# Patient Record
Sex: Female | Born: 1965
Health system: Southern US, Community
[De-identification: ages and names within clinical notes are randomized; demographics above are authoritative.]

## PROBLEM LIST (undated history)

## (undated) DIAGNOSIS — Z809 Family history of malignant neoplasm, unspecified: Secondary | ICD-10-CM

## (undated) DIAGNOSIS — Z1379 Encounter for other screening for genetic and chromosomal anomalies: Principal | ICD-10-CM

## (undated) HISTORY — PX: BREAST EXCISIONAL BIOPSY: SUR124

## (undated) HISTORY — PX: WISDOM TOOTH EXTRACTION: SHX21

## (undated) HISTORY — DX: Family history of malignant neoplasm, unspecified: Z80.9

## (undated) HISTORY — DX: Encounter for other screening for genetic and chromosomal anomalies: Z13.79

---

## 2000-01-23 ENCOUNTER — Encounter: Payer: Self-pay | Admitting: Emergency Medicine

## 2000-01-23 ENCOUNTER — Emergency Department (HOSPITAL_COMMUNITY): Admission: EM | Admit: 2000-01-23 | Discharge: 2000-01-23 | Payer: Self-pay | Admitting: Emergency Medicine

## 2016-09-24 DIAGNOSIS — M791 Myalgia: Secondary | ICD-10-CM | POA: Diagnosis not present

## 2016-09-24 DIAGNOSIS — M75100 Unspecified rotator cuff tear or rupture of unspecified shoulder, not specified as traumatic: Secondary | ICD-10-CM | POA: Diagnosis not present

## 2016-10-20 DIAGNOSIS — Z0000 Encounter for general adult medical examination without abnormal findings: Secondary | ICD-10-CM | POA: Diagnosis not present

## 2016-10-20 DIAGNOSIS — Z13220 Encounter for screening for lipoid disorders: Secondary | ICD-10-CM | POA: Diagnosis not present

## 2016-12-05 DIAGNOSIS — Z1211 Encounter for screening for malignant neoplasm of colon: Secondary | ICD-10-CM | POA: Diagnosis not present

## 2016-12-05 DIAGNOSIS — D126 Benign neoplasm of colon, unspecified: Secondary | ICD-10-CM | POA: Diagnosis not present

## 2017-03-06 DIAGNOSIS — M9908 Segmental and somatic dysfunction of rib cage: Secondary | ICD-10-CM | POA: Diagnosis not present

## 2017-03-06 DIAGNOSIS — M791 Myalgia: Secondary | ICD-10-CM | POA: Diagnosis not present

## 2017-03-06 DIAGNOSIS — M9902 Segmental and somatic dysfunction of thoracic region: Secondary | ICD-10-CM | POA: Diagnosis not present

## 2017-05-18 DIAGNOSIS — J069 Acute upper respiratory infection, unspecified: Secondary | ICD-10-CM | POA: Diagnosis not present

## 2017-06-30 DIAGNOSIS — D2371 Other benign neoplasm of skin of right lower limb, including hip: Secondary | ICD-10-CM | POA: Diagnosis not present

## 2017-06-30 DIAGNOSIS — L821 Other seborrheic keratosis: Secondary | ICD-10-CM | POA: Diagnosis not present

## 2017-06-30 DIAGNOSIS — L720 Epidermal cyst: Secondary | ICD-10-CM | POA: Diagnosis not present

## 2017-06-30 DIAGNOSIS — D485 Neoplasm of uncertain behavior of skin: Secondary | ICD-10-CM | POA: Diagnosis not present

## 2017-06-30 DIAGNOSIS — D1801 Hemangioma of skin and subcutaneous tissue: Secondary | ICD-10-CM | POA: Diagnosis not present

## 2017-06-30 DIAGNOSIS — L570 Actinic keratosis: Secondary | ICD-10-CM | POA: Diagnosis not present

## 2017-09-01 HISTORY — PX: BREAST EXCISIONAL BIOPSY: SUR124

## 2017-10-26 ENCOUNTER — Other Ambulatory Visit: Payer: Self-pay | Admitting: Family Medicine

## 2017-10-26 ENCOUNTER — Other Ambulatory Visit (HOSPITAL_COMMUNITY)
Admission: RE | Admit: 2017-10-26 | Discharge: 2017-10-26 | Disposition: A | Discharge status: Home / Self Care | Payer: 59 | Source: Ambulatory Visit | Attending: Family Medicine | Admitting: Family Medicine

## 2017-10-26 DIAGNOSIS — Z0000 Encounter for general adult medical examination without abnormal findings: Secondary | ICD-10-CM | POA: Diagnosis not present

## 2017-10-26 DIAGNOSIS — Z124 Encounter for screening for malignant neoplasm of cervix: Secondary | ICD-10-CM | POA: Insufficient documentation

## 2017-10-26 DIAGNOSIS — Z131 Encounter for screening for diabetes mellitus: Secondary | ICD-10-CM | POA: Diagnosis not present

## 2017-10-26 DIAGNOSIS — Z136 Encounter for screening for cardiovascular disorders: Secondary | ICD-10-CM | POA: Diagnosis not present

## 2017-10-28 LAB — CYTOLOGY - PAP: Diagnosis: NEGATIVE

## 2017-11-03 ENCOUNTER — Other Ambulatory Visit: Payer: Self-pay | Admitting: Family Medicine

## 2017-11-03 ENCOUNTER — Encounter: Payer: Self-pay | Admitting: Genetic Counselor

## 2017-11-03 ENCOUNTER — Telehealth: Payer: Self-pay | Admitting: Genetic Counselor

## 2017-11-03 DIAGNOSIS — Z139 Encounter for screening, unspecified: Secondary | ICD-10-CM

## 2017-11-03 NOTE — Telephone Encounter (Signed)
Genetic counseling appt has been scheduled for the pt to see Roma Kayser on 4/22 at 9am. Pt aware to arrive 30 minutes early. Letter mailed.

## 2017-11-30 ENCOUNTER — Ambulatory Visit: Payer: Self-pay

## 2017-12-17 ENCOUNTER — Ambulatory Visit
Admission: RE | Admit: 2017-12-17 | Discharge: 2017-12-17 | Disposition: A | Discharge status: Home / Self Care | Payer: 59 | Source: Ambulatory Visit | Attending: Family Medicine | Admitting: Family Medicine

## 2017-12-17 DIAGNOSIS — Z1231 Encounter for screening mammogram for malignant neoplasm of breast: Secondary | ICD-10-CM | POA: Diagnosis not present

## 2017-12-17 DIAGNOSIS — Z139 Encounter for screening, unspecified: Secondary | ICD-10-CM

## 2017-12-21 ENCOUNTER — Other Ambulatory Visit: Payer: Self-pay

## 2017-12-21 ENCOUNTER — Encounter: Payer: Self-pay | Admitting: Genetic Counselor

## 2017-12-25 ENCOUNTER — Telehealth: Payer: Self-pay | Admitting: Genetic Counselor

## 2017-12-25 NOTE — Telephone Encounter (Signed)
Pt cld to reschedule genetic counseling appt. Appt has been rescheduled for the pt to see Ofri on 5/10 at

## 2018-01-08 ENCOUNTER — Encounter: Payer: Self-pay | Admitting: Genetic Counselor

## 2018-01-08 ENCOUNTER — Inpatient Hospital Stay: Payer: 59

## 2018-01-08 ENCOUNTER — Inpatient Hospital Stay: Payer: 59 | Attending: Genetic Counselor | Admitting: Genetic Counselor

## 2018-01-08 DIAGNOSIS — Z7183 Encounter for nonprocreative genetic counseling: Secondary | ICD-10-CM | POA: Diagnosis not present

## 2018-01-08 DIAGNOSIS — Z809 Family history of malignant neoplasm, unspecified: Secondary | ICD-10-CM

## 2018-01-08 NOTE — Progress Notes (Signed)
[facility]      Initial Visit   Patient Name: Alisha [name] Patient DOB: 06-13-1966 Patient Age: 52 y.o. Encounter Date: 01/08/2018  Referring Provider: Kelton Pillar, MD  Primary Care Provider: Kelton Pillar, MD  Reason for Visit: Evaluate for hereditary susceptibility to cancer    Assessment and Plan:  . Ms. Alisha [name]'s family history is not suggestive of a hereditary predisposition to cancer given the ages at diagnosis of breast and colon cancer in her mother.   . Testing is available to determine whether she has a pathogenic mutation that will impact her screening and risk-reduction for cancer. A negative result will be reassuring.  . Ms. Alisha [name] wished to pursue genetic testing and self-pay ($250) and a blood sample will be sent for analysis of the 83 genes on [facility]'s Multi-Cancer panel (ALK, APC, ATM, AXIN2, BAP1, BARD1, BLM, BMPR1A, BRCA1, BRCA2, BRIP1, CASR, CDC73, CDH1, CDK4, CDKN1B, CDKN1C, CDKN2A, CEBPA, CHEK2, CTNNA1, DICER1, DIS3L2, EGFR, EPCAM, FH, FLCN, GATA2, GPC3, GREM1, HOXB13, HRAS, KIT, MAX, MEN1, MET, MITF, MLH1, MSH2, MSH3, MSH6, MUTYH, NBN, NF1, NF2, NTHL1, PALB2, PDGFRA, PHOX2B, PMS2, POLD1, POLE, POT1, PRKAR1A, PTCH1, PTEN, RAD50, RAD51C, RAD51D, RB1, RECQL4, RET, RUNX1, SDHA, SDHAF2, SDHB, SDHC, SDHD, SMAD4, SMARCA4, SMARCB1, SMARCE1, STK11, SUFU, TERC, TERT, TMEM127, TP53, TSC1, TSC2, VHL, WRN, WT1).   . Results should be available in approximately 2-3 weeks, at which point we will contact her and address implications for her as well as address genetic testing for at-risk family members, if needed.     Dr. Jana Hakim was available for questions concerning this case. Total time spent by me in face-to-face counseling was approximately 30 minutes.   _____________________________________________________________________   History of Present Illness: Ms. Alisha [name], a 52 y.o. female, is being seen at the [facility] due to a family history of cancer. She presents to clinic today to discuss the possibility of a hereditary predisposition to cancer and discuss whether genetic testing is warranted.  Ms. Alisha [name] has no personal history of cancer. She stated that she undergoes a yearly mammogram, clinical breast exam and gynecologic exam. She reported having a colonoscopy around age [age] and had one polyps removed (type not specified). She was asked to return in 5 years.  Past Medical History:  Diagnosis Date  . Family history of cancer     Social History   Socioeconomic History  . Marital status: Married    Spouse name: Not on file  . Number of children: Not on file  . Years of education: Not on file  . Highest education level: Not on file  Occupational History  . Not on file  Social Needs  . Financial resource strain: Not on file  . Food insecurity:    Worry: Not on file    Inability: Not on file  . Transportation needs:    Medical: Not on file    Non-medical: Not on file  Tobacco Use  . Smoking status: Not on file  Substance and Sexual Activity  . Alcohol use: Not on file  . Drug use: Not on file  . Sexual activity: Not on file  Lifestyle  . Physical activity:    Days per week: Not on file    Minutes per session: Not on file  . Stress: Not on file  Relationships  . Social connections:    Talks on phone: Not on file    Gets together: Not on file  Attends religious service: Not on file    Active member of club or organization: Not on file    Attends meetings of clubs or organizations: Not on file    Relationship status: Not on file  Other Topics Concern  . Not on file  Social History Narrative  . Not on file     Family History:  During the visit, a 4-generation pedigree was obtained. Family tree will be scanned in the Media tab in Epic  Significant diagnoses include the following:  Family History  Problem Relation Age of Onset  . Breast cancer Mother 9  . Colon  cancer Mother 67       metastatic BC recently; 8 yo currently  . Melanoma Father        dx 6s; currently 41  . Cancer Paternal Aunt        unk. primary; deceased 78s    Additionally, Ms. Alisha [name] has no children. She has one brother (age 18). Her mother (noted above) was an only child. Her father (noted above) had 5 sisters.  Ms. Alisha [name] ancestry is Caucasian - NOS. There is no known Jewish ancestry and no consanguinity.  Discussion: We reviewed the characteristics, features and inheritance patterns of hereditary cancer syndromes. We discussed her risk of harboring a mutation in the context of her personal and family history. We discussed the process of genetic testing, insurance coverage and implications of results: positive, negative and variant of unknown significance (VUS).    Ms. Alisha [name] questions were answered to her satisfaction today and she is welcome to call with any additional questions or concerns. Thank you for the referral and allowing [place] to share in the care of your patient.    Steele Berg, MS, [place] Certified Genetic Counselor phone: [phone] [email or web address]

## 2018-01-13 ENCOUNTER — Other Ambulatory Visit: Payer: Self-pay

## 2018-01-13 ENCOUNTER — Encounter: Payer: Self-pay | Admitting: Genetic Counselor

## 2018-01-14 ENCOUNTER — Encounter: Payer: Self-pay | Admitting: Genetic Counselor

## 2018-01-14 DIAGNOSIS — Z1379 Encounter for other screening for genetic and chromosomal anomalies: Secondary | ICD-10-CM | POA: Insufficient documentation

## 2018-01-14 NOTE — Progress Notes (Signed)
[facility]       Genetic Test Results    Patient Name: Alisha [name] Patient DOB: 1966/05/21 Patient Age: 52 y.o. Encounter Date: 01/15/2018  Referring Provider: Kelton Pillar, MD  Primary Care Provider: Kelton Pillar, MD   Alisha [name] was called today to discuss genetic test results. Please see the Genetics note from her visit on 01/08/2018 for a detailed discussion of her personal and family history.  Genetic Testing: At the time of Alisha [name]'s visit, she decided to pursue genetic testing of multiple genes associated with hereditary susceptibility to cancer. Testing included sequencing and deletion/duplication analysis. Testing did not reveal a pathogenic mutation in any of the genes analyzed.  A copy of the genetic test report will be scanned into Epic under the Media tab.  The genes analyzed were the 83 genes on [facility]'s Multi-Cancer panel (ALK, APC, ATM, AXIN2, BAP1, BARD1, BLM, BMPR1A, BRCA1, BRCA2, BRIP1, CASR, CDC73, CDH1, CDK4, CDKN1B, CDKN1C, CDKN2A, CEBPA, CHEK2, CTNNA1, DICER1, DIS3L2, EGFR, EPCAM, FH, FLCN, GATA2, GPC3, GREM1, HOXB13, HRAS, KIT, MAX, MEN1, MET, MITF, MLH1, MSH2, MSH3, MSH6, MUTYH, NBN, NF1, NF2, NTHL1, PALB2, PDGFRA, PHOX2B, PMS2, POLD1, POLE, POT1, PRKAR1A, PTCH1, PTEN, RAD50, RAD51C, RAD51D, RB1, RECQL4, RET, RUNX1, SDHA, SDHAF2, SDHB, SDHC, SDHD, SMAD4, SMARCA4, SMARCB1, SMARCE1, STK11, SUFU, TERC, TERT, TMEM127, TP53, TSC1, TSC2, VHL, WRN, WT1).  Since the current test is not perfect, it is possible that there may be a gene mutation that current testing cannot detect, but that chance is small. It is possible that a different genetic factor, which has not yet been discovered or is not on this panel, is responsible for the cancer diagnoses in the family. Again, the likelihood of this is low. No additional testing is recommended at this time for Alisha [name].  Cancer Screening: These results are reassuring and indicate that Ms.  [name] does not likely have an increased risk of cancer due to a mutation in one of these genes. She is recommended to undergo cancer screenings for individuals in the general population. The [employer]  recommends that women follow the breast screening recommendations below, but that these may need to be modified based on other risk factors such as dense breasts, biopsy history or family history.  Breast awareness - Women should be familiar with their breasts and promptly report changes to their healthcare provider.   Starting at age 61: Breast exam, risk assessment, and risk reduction counseling by the provider and mammogram every year. The provider may discuss screening with tomosynthesis.  Alisha [name] is also recommended to undergo a yearly gynecologic exam and speak with her GI doctor as to when to have her next colonoscopy.  Family Members: Family members may also be at some increased risk of developing cancer, over the general population risk, simply due to the family history. Women are recommended to have a yearly mammogram beginning at age 57, a yearly clinical breast exam, a yearly gynecologic exam and perform monthly breast self-exams. Colon cancer screening is recommended to begin by age 98 in both men and women, unless there is a family history of colon cancer or colon polyps or an individual has a personal history to warrant initiating screening at a younger age.  Lastly, cancer genetics is a rapidly advancing field and it is possible that new genetic tests will be appropriate for Alisha [name] in the future. Alisha [name] is encouraged to remain  in contact with Genetics on an annual basis so we can update her personal and family histories, and let her know of advances in cancer genetics that may benefit the family. Alisha [name] questions were answered to her satisfaction today, and she knows she is welcome to call anytime with additional questions.     Steele Berg, MS,  [place] Certified Genetic Counselor phone: [phone]

## 2018-01-15 ENCOUNTER — Ambulatory Visit: Payer: Self-pay | Admitting: Genetic Counselor

## 2018-01-15 DIAGNOSIS — Z1379 Encounter for other screening for genetic and chromosomal anomalies: Secondary | ICD-10-CM

## 2018-01-15 HISTORY — DX: Encounter for other screening for genetic and chromosomal anomalies: Z13.79

## 2018-05-28 ENCOUNTER — Other Ambulatory Visit: Payer: Self-pay | Admitting: Physician Assistant

## 2018-05-28 DIAGNOSIS — N63 Unspecified lump in unspecified breast: Secondary | ICD-10-CM | POA: Diagnosis not present

## 2018-06-02 ENCOUNTER — Ambulatory Visit
Admission: RE | Admit: 2018-06-02 | Discharge: 2018-06-02 | Disposition: A | Discharge status: Home / Self Care | Payer: 59 | Source: Ambulatory Visit | Attending: Physician Assistant | Admitting: Physician Assistant

## 2018-06-02 ENCOUNTER — Other Ambulatory Visit: Payer: Self-pay | Admitting: Physician Assistant

## 2018-06-02 DIAGNOSIS — N63 Unspecified lump in unspecified breast: Secondary | ICD-10-CM

## 2018-06-02 DIAGNOSIS — N6323 Unspecified lump in the left breast, lower outer quadrant: Secondary | ICD-10-CM | POA: Diagnosis not present

## 2018-06-02 DIAGNOSIS — R922 Inconclusive mammogram: Secondary | ICD-10-CM | POA: Diagnosis not present

## 2018-06-07 ENCOUNTER — Ambulatory Visit
Admission: RE | Admit: 2018-06-07 | Discharge: 2018-06-07 | Disposition: A | Discharge status: Home / Self Care | Payer: 59 | Source: Ambulatory Visit | Attending: Physician Assistant | Admitting: Physician Assistant

## 2018-06-07 ENCOUNTER — Other Ambulatory Visit: Payer: Self-pay | Admitting: Physician Assistant

## 2018-06-07 DIAGNOSIS — R928 Other abnormal and inconclusive findings on diagnostic imaging of breast: Secondary | ICD-10-CM | POA: Diagnosis not present

## 2018-06-07 DIAGNOSIS — N63 Unspecified lump in unspecified breast: Secondary | ICD-10-CM

## 2018-06-07 DIAGNOSIS — N6012 Diffuse cystic mastopathy of left breast: Secondary | ICD-10-CM | POA: Diagnosis not present

## 2018-06-07 DIAGNOSIS — N6489 Other specified disorders of breast: Secondary | ICD-10-CM | POA: Diagnosis not present

## 2018-06-15 ENCOUNTER — Other Ambulatory Visit: Payer: Self-pay | Admitting: Surgery

## 2018-06-15 DIAGNOSIS — N6320 Unspecified lump in the left breast, unspecified quadrant: Secondary | ICD-10-CM | POA: Diagnosis not present

## 2018-06-16 ENCOUNTER — Other Ambulatory Visit: Payer: Self-pay | Admitting: Surgery

## 2018-06-16 DIAGNOSIS — N6320 Unspecified lump in the left breast, unspecified quadrant: Secondary | ICD-10-CM

## 2018-06-24 ENCOUNTER — Ambulatory Visit
Admission: RE | Admit: 2018-06-24 | Discharge: 2018-06-24 | Disposition: A | Discharge status: Home / Self Care | Payer: 59 | Source: Ambulatory Visit | Attending: Surgery | Admitting: Surgery

## 2018-06-24 DIAGNOSIS — Z803 Family history of malignant neoplasm of breast: Secondary | ICD-10-CM | POA: Diagnosis not present

## 2018-06-24 DIAGNOSIS — N6320 Unspecified lump in the left breast, unspecified quadrant: Secondary | ICD-10-CM

## 2018-06-24 MED ORDER — GADOBENATE DIMEGLUMINE 529 MG/ML IV SOLN
10.0000 mL | Freq: Once | INTRAVENOUS | Status: AC | PRN
  Administered 2018-06-24: 10 mL via INTRAVENOUS

## 2018-06-30 DIAGNOSIS — D2261 Melanocytic nevi of right upper limb, including shoulder: Secondary | ICD-10-CM | POA: Diagnosis not present

## 2018-06-30 DIAGNOSIS — D2262 Melanocytic nevi of left upper limb, including shoulder: Secondary | ICD-10-CM | POA: Diagnosis not present

## 2018-06-30 DIAGNOSIS — L821 Other seborrheic keratosis: Secondary | ICD-10-CM | POA: Diagnosis not present

## 2018-07-01 ENCOUNTER — Other Ambulatory Visit: Payer: Self-pay | Admitting: Surgery

## 2018-07-01 DIAGNOSIS — N6022 Fibroadenosis of left breast: Secondary | ICD-10-CM

## 2018-07-07 ENCOUNTER — Other Ambulatory Visit: Payer: Self-pay | Admitting: Surgery

## 2018-07-07 DIAGNOSIS — N6022 Fibroadenosis of left breast: Secondary | ICD-10-CM

## 2018-07-15 ENCOUNTER — Encounter (HOSPITAL_BASED_OUTPATIENT_CLINIC_OR_DEPARTMENT_OTHER): Payer: Self-pay | Admitting: *Deleted

## 2018-07-15 ENCOUNTER — Other Ambulatory Visit: Payer: Self-pay

## 2018-07-20 ENCOUNTER — Ambulatory Visit
Admission: RE | Admit: 2018-07-20 | Discharge: 2018-07-20 | Disposition: A | Discharge status: Home / Self Care | Payer: 59 | Source: Ambulatory Visit | Attending: Surgery | Admitting: Surgery

## 2018-07-20 DIAGNOSIS — R928 Other abnormal and inconclusive findings on diagnostic imaging of breast: Secondary | ICD-10-CM | POA: Diagnosis not present

## 2018-07-20 DIAGNOSIS — N6022 Fibroadenosis of left breast: Secondary | ICD-10-CM

## 2018-07-20 NOTE — Progress Notes (Signed)
Ensure pre surgery drink given with instructions to complete by 0430 dos, pt verbalized understanding. 

## 2018-07-20 NOTE — H&P (Signed)
Alisha [name]  Location: [facility] Patient #: [id number] DOB: 03-18-1966 Married / Language: English / Race: White Female   History of Present Illness   The patient is a 52 year old female who presents with a breast mass. This patient is referred by Dr. Kristopher Oppenheim for evaluation of multiple left breast masses. She recently palpated a mass in the left breast medially. This prompted an ultrasound which showed a possible lipoma. Several other small 7-8 mm masses were seen in that breast. Her breasts were extremely dense. These were not seen on a mammogram earlier this year. She denies nipple discharge. She has no previous problems with her breasts. She has a strong family history of breast cancer in multiple family members including her mother at age 9. She is genetically negative for the breast cancer genes. She is otherwise healthy without complaints. She underwent stereotactic biopsy of a lesion at the 12 o'clock position and the 6 o'clock position of the left breast. The 12:00 lesion showed fibrocystic changes and the 6:00 lesion showed a complex sclerosing lesion. A third area was not biopsied. Again, the 4 cm mass in the medial left breast is consistent with a lipoma   Past Surgical History (Alisha [name], RMA;  Breast Biopsy  Left. Colon Polyp Removal - Colonoscopy  Oral Surgery   Diagnostic Studies History (Alisha [name], RMA Colonoscopy  1-5 years ago Mammogram  within last year Pap Smear  1-5 years ago  Allergies Sulfa Antibiotics  Rash. Allergies Reconciled   Medication History (Alisha [name], RMA;  Caltrate 600 (Oral) Specific strength unknown - Active. One-A-Day Womens (Oral) Active. Medications Reconciled  Social History Producer, television/film/video, RMA;  No alcohol use  No caffeine use  No drug use  Tobacco use  Never smoker.  Family History (Alisha [name], RMA;  Arthritis  Mother. Breast Cancer   Mother. Colon Cancer  Mother. Colon Polyps  Father, Mother. Hypertension  Father, Mother. Melanoma  Father, Mother.  Pregnancy / Birth History Producer, television/film/video, RMA; Age at menarche  28 years. Contraceptive History  Oral contraceptives. Gravida  0 Irregular periods  Para  0  Other Problems Alisha [name], RMA; 06/15/2018 10:32 AM) No pertinent past medical history     Review of Systems [facility] RMA;  General Not Present- Appetite Loss, Chills, Fatigue, Fever, Night Sweats, Weight Gain and Weight Loss. Skin Not Present- Change in Wart/Mole, Dryness, Hives, Jaundice, New Lesions, Non-Healing Wounds, Rash and Ulcer. HEENT Not Present- Earache, Hearing Loss, Hoarseness, Nose Bleed, Oral Ulcers, Ringing in the Ears, Seasonal Allergies, Sinus Pain, Sore Throat, Visual Disturbances, Wears glasses/contact lenses and Yellow Eyes. Respiratory Not Present- Bloody sputum, Chronic Cough, Difficulty Breathing, Snoring and Wheezing. Breast Present- Breast Mass. Not Present- Breast Pain, Nipple Discharge and Skin Changes. Cardiovascular Not Present- Chest Pain, Difficulty Breathing Lying Down, Leg Cramps, Palpitations, Rapid Heart Rate, Shortness of Breath and Swelling of Extremities. Gastrointestinal Not Present- Abdominal Pain, Bloating, Bloody Stool, Change in Bowel Habits, Chronic diarrhea, Constipation, Difficulty Swallowing, Excessive gas, Gets full quickly at meals, Hemorrhoids, Indigestion, Nausea, Rectal Pain and Vomiting. Female Genitourinary Not Present- Frequency, Nocturia, Painful Urination, Pelvic Pain and Urgency. Musculoskeletal Not Present- Back Pain, Joint Pain, Joint Stiffness, Muscle Pain, Muscle Weakness and Swelling of Extremities. Neurological Not Present- Decreased Memory, Fainting, Headaches, Numbness, Seizures, Tingling, Tremor, Trouble walking and Weakness. Psychiatric Not Present- Anxiety, Bipolar, Change in Sleep Pattern, Depression, Fearful and  Frequent crying. Endocrine Not Present- Cold Intolerance, Excessive Hunger, Hair Changes, Heat Intolerance, Hot  flashes and New Diabetes. Hematology Not Present- Blood Thinners, Easy Bruising, Excessive bleeding, Gland problems, HIV and Persistent Infections.  Vitals  06/15/2018 10:33 AM Weight: 113.6 lb Height: 65in Body Surface Area: 1.56 m Body Mass Index: 18.9 kg/m  Pain Level: 0/10 Temp.: 98.77F(Temporal)  Pulse: 68 (Regular)  P.OX: 97% (Room air) BP: 122/82 (Sitting, Right Arm, Standard)     Physical Exam  General Mental Status-Alert. General Appearance-Consistent with stated age. Hydration-Well hydrated. Voice-Normal.  Head and Neck Head-normocephalic, atraumatic with no lesions or palpable masses. Trachea-midline. Thyroid Gland Characteristics - normal size and consistency.  Eye Eyeball - Bilateral-Extraocular movements intact. Sclera/Conjunctiva - Bilateral-No scleral icterus.  Chest and Lung Exam Chest and lung exam reveals -quiet, even and easy respiratory effort with no use of accessory muscles and on auscultation, normal breath sounds, no adventitious sounds and normal vocal resonance. Inspection Chest Wall - Normal. Back - normal.  Breast Breast - Left-Symmetric, Non Tender, No Biopsy scars, no Dimpling, No Inflammation, No Lumpectomy scars, No Mastectomy scars, No Peau d' Orange. Breast - Right-Symmetric, Non Tender, No Biopsy scars, no Dimpling, No Inflammation, No Lumpectomy scars, No Mastectomy scars, No Peau d' Orange. Note: She has small, dense breasts. I can palpate the soft mass at the 7 o'clock position of the left breast. She has ecchymosis and bruising from the 2 stereotactic biopsy sites and I can feel small hematomas but no discrete masses. There are no right breast masses.  I have reviewed her mammograms, ultrasound, and pathology results.   Cardiovascular Cardiovascular examination reveals -normal  heart sounds, regular rate and rhythm with no murmurs and normal pedal pulses bilaterally.  Abdomen - Did not examine.  Neurologic - Did not examine.  Musculoskeletal - Did not examine.  Lymphatic Head & Neck  General Head & Neck Lymphatics: Bilateral - Description - Normal. Axillary  General Axillary Region: Bilateral - Description - Normal. Tenderness - Non Tender. Note: There is one slightly enlarged lymph node in the left axilla Femoral & Inguinal - Did not examine.    Assessment & Plan    BREAST MASS, LEFT (N63.20) Impression: We discussed the mammographic and radiologic findings in her breasts. Again, she has multiple left breast masses with extremely dense breasts and a strong family history of breast cancer. Bilateral breast MRIs are strongly recommended given the multiple masses in her complex sclerosing lesion. She will at least need a lumpectomy regarding the lipoma as well as a radioactive seed guided lumpectomy regarding the complex sclerosing lesion. An MRI definitely needs to be done preoperatively to see if there are any other lesions prior to going to the operating room. I will call her back after the MRI to discuss the results and further plans  Addendum: MRI was otherwise negative so the plan is to proceed to the OR for a radioactive seed guided left breast lumpectomy to remove just the complex sclerosing lesion seen on biopsy. I again discussed this with the patient.

## 2018-07-21 ENCOUNTER — Encounter (HOSPITAL_BASED_OUTPATIENT_CLINIC_OR_DEPARTMENT_OTHER)
Admission: RE | Disposition: A | Discharge status: Home / Self Care | Payer: Self-pay | Source: Ambulatory Visit | Attending: Surgery

## 2018-07-21 ENCOUNTER — Ambulatory Visit (HOSPITAL_BASED_OUTPATIENT_CLINIC_OR_DEPARTMENT_OTHER): Payer: 59 | Admitting: Certified Registered"

## 2018-07-21 ENCOUNTER — Other Ambulatory Visit: Payer: Self-pay

## 2018-07-21 ENCOUNTER — Ambulatory Visit (HOSPITAL_BASED_OUTPATIENT_CLINIC_OR_DEPARTMENT_OTHER)
Admission: RE | Admit: 2018-07-21 | Discharge: 2018-07-21 | Disposition: A | Discharge status: Home / Self Care | Payer: 59 | Source: Ambulatory Visit | Attending: Surgery | Admitting: Surgery

## 2018-07-21 ENCOUNTER — Ambulatory Visit
Admission: RE | Admit: 2018-07-21 | Discharge: 2018-07-21 | Disposition: A | Discharge status: Home / Self Care | Payer: 59 | Source: Ambulatory Visit | Attending: Surgery | Admitting: Surgery

## 2018-07-21 ENCOUNTER — Encounter (HOSPITAL_BASED_OUTPATIENT_CLINIC_OR_DEPARTMENT_OTHER): Payer: Self-pay | Admitting: Certified Registered"

## 2018-07-21 DIAGNOSIS — D242 Benign neoplasm of left breast: Secondary | ICD-10-CM | POA: Diagnosis not present

## 2018-07-21 DIAGNOSIS — R921 Mammographic calcification found on diagnostic imaging of breast: Secondary | ICD-10-CM | POA: Diagnosis not present

## 2018-07-21 DIAGNOSIS — N6022 Fibroadenosis of left breast: Secondary | ICD-10-CM

## 2018-07-21 DIAGNOSIS — D1739 Benign lipomatous neoplasm of skin and subcutaneous tissue of other sites: Secondary | ICD-10-CM | POA: Diagnosis not present

## 2018-07-21 DIAGNOSIS — D171 Benign lipomatous neoplasm of skin and subcutaneous tissue of trunk: Secondary | ICD-10-CM | POA: Diagnosis not present

## 2018-07-21 DIAGNOSIS — N6012 Diffuse cystic mastopathy of left breast: Secondary | ICD-10-CM | POA: Diagnosis not present

## 2018-07-21 DIAGNOSIS — R928 Other abnormal and inconclusive findings on diagnostic imaging of breast: Secondary | ICD-10-CM | POA: Diagnosis not present

## 2018-07-21 DIAGNOSIS — N6489 Other specified disorders of breast: Secondary | ICD-10-CM | POA: Diagnosis not present

## 2018-07-21 HISTORY — PX: MASS EXCISION: SHX2000

## 2018-07-21 HISTORY — PX: BREAST LUMPECTOMY WITH RADIOACTIVE SEED LOCALIZATION: SHX6424

## 2018-07-21 SURGERY — BREAST LUMPECTOMY WITH RADIOACTIVE SEED LOCALIZATION
Anesthesia: General | Site: Breast | Laterality: Left

## 2018-07-21 MED ORDER — LACTATED RINGERS IV SOLN
INTRAVENOUS | Status: DC
  Administered 2018-07-21 (×2): via INTRAVENOUS

## 2018-07-21 MED ORDER — EPHEDRINE 5 MG/ML INJ
INTRAVENOUS | Status: AC
  Filled 2018-07-21: qty 30

## 2018-07-21 MED ORDER — PROMETHAZINE HCL 25 MG/ML IJ SOLN: 6.2500 mg | INTRAMUSCULAR | Status: DC | PRN

## 2018-07-21 MED ORDER — BUPIVACAINE-EPINEPHRINE (PF) 0.25% -1:200000 IJ SOLN
INTRAMUSCULAR | Status: AC
  Filled 2018-07-21: qty 90

## 2018-07-21 MED ORDER — MIDAZOLAM HCL 2 MG/2ML IJ SOLN
1.0000 mg | INTRAMUSCULAR | Status: DC | PRN
  Administered 2018-07-21: 2 mg via INTRAVENOUS

## 2018-07-21 MED ORDER — DEXAMETHASONE SODIUM PHOSPHATE 10 MG/ML IJ SOLN
INTRAMUSCULAR | Status: AC
  Filled 2018-07-21: qty 3

## 2018-07-21 MED ORDER — SCOPOLAMINE 1 MG/3DAYS TD PT72: 1.0000 | MEDICATED_PATCH | Freq: Once | TRANSDERMAL | Status: DC | PRN

## 2018-07-21 MED ORDER — ONDANSETRON HCL 4 MG/2ML IJ SOLN
INTRAMUSCULAR | Status: DC | PRN
  Administered 2018-07-21: 4 mg via INTRAVENOUS

## 2018-07-21 MED ORDER — GLYCOPYRROLATE PF 0.2 MG/ML IJ SOSY
PREFILLED_SYRINGE | INTRAMUSCULAR | Status: AC
  Filled 2018-07-21: qty 1

## 2018-07-21 MED ORDER — CHLORHEXIDINE GLUCONATE CLOTH 2 % EX PADS: 6.0000 | MEDICATED_PAD | Freq: Once | CUTANEOUS | Status: DC

## 2018-07-21 MED ORDER — CEFAZOLIN SODIUM-DEXTROSE 2-4 GM/100ML-% IV SOLN
INTRAVENOUS | Status: AC
  Filled 2018-07-21: qty 100

## 2018-07-21 MED ORDER — PROPOFOL 500 MG/50ML IV EMUL
INTRAVENOUS | Status: DC | PRN
  Administered 2018-07-21: 25 ug/kg/min via INTRAVENOUS

## 2018-07-21 MED ORDER — FENTANYL CITRATE (PF) 100 MCG/2ML IJ SOLN
INTRAMUSCULAR | Status: AC
  Filled 2018-07-21: qty 2

## 2018-07-21 MED ORDER — PROPOFOL 500 MG/50ML IV EMUL
INTRAVENOUS | Status: AC
  Filled 2018-07-21: qty 100

## 2018-07-21 MED ORDER — FENTANYL CITRATE (PF) 100 MCG/2ML IJ SOLN
50.0000 ug | INTRAMUSCULAR | Status: DC | PRN
  Administered 2018-07-21: 50 ug via INTRAVENOUS

## 2018-07-21 MED ORDER — OXYCODONE HCL 5 MG PO TABS: 5.0000 mg | ORAL_TABLET | Freq: Four times a day (QID) | ORAL | 0 refills | Status: DC | PRN

## 2018-07-21 MED ORDER — LIDOCAINE HCL (CARDIAC) PF 100 MG/5ML IV SOSY
PREFILLED_SYRINGE | INTRAVENOUS | Status: DC | PRN
  Administered 2018-07-21: 60 mg via INTRAVENOUS

## 2018-07-21 MED ORDER — GABAPENTIN 300 MG PO CAPS
ORAL_CAPSULE | ORAL | Status: AC
  Filled 2018-07-21: qty 1

## 2018-07-21 MED ORDER — MIDAZOLAM HCL 2 MG/2ML IJ SOLN
INTRAMUSCULAR | Status: AC
  Filled 2018-07-21: qty 2

## 2018-07-21 MED ORDER — DEXAMETHASONE SODIUM PHOSPHATE 4 MG/ML IJ SOLN
INTRAMUSCULAR | Status: DC | PRN
  Administered 2018-07-21: 10 mg via INTRAVENOUS

## 2018-07-21 MED ORDER — ACETAMINOPHEN 500 MG PO TABS
ORAL_TABLET | ORAL | Status: AC
  Filled 2018-07-21: qty 2

## 2018-07-21 MED ORDER — ACETAMINOPHEN 500 MG PO TABS
1000.0000 mg | ORAL_TABLET | ORAL | Status: AC
  Administered 2018-07-21: 1000 mg via ORAL

## 2018-07-21 MED ORDER — PROPOFOL 10 MG/ML IV BOLUS
INTRAVENOUS | Status: DC | PRN
  Administered 2018-07-21: 120 mg via INTRAVENOUS

## 2018-07-21 MED ORDER — GABAPENTIN 300 MG PO CAPS
300.0000 mg | ORAL_CAPSULE | ORAL | Status: AC
  Administered 2018-07-21: 300 mg via ORAL

## 2018-07-21 MED ORDER — OXYCODONE HCL 5 MG/5ML PO SOLN: 5.0000 mg | Freq: Once | ORAL | Status: DC | PRN

## 2018-07-21 MED ORDER — ONDANSETRON HCL 4 MG/2ML IJ SOLN
INTRAMUSCULAR | Status: AC
  Filled 2018-07-21: qty 12

## 2018-07-21 MED ORDER — CEFAZOLIN SODIUM-DEXTROSE 2-4 GM/100ML-% IV SOLN
2.0000 g | INTRAVENOUS | Status: AC
  Administered 2018-07-21: 2 g via INTRAVENOUS

## 2018-07-21 MED ORDER — LIDOCAINE 2% (20 MG/ML) 5 ML SYRINGE
INTRAMUSCULAR | Status: AC
  Filled 2018-07-21: qty 15

## 2018-07-21 MED ORDER — BUPIVACAINE-EPINEPHRINE (PF) 0.25% -1:200000 IJ SOLN
INTRAMUSCULAR | Status: DC | PRN
  Administered 2018-07-21: 16 mL

## 2018-07-21 MED ORDER — HYDROMORPHONE HCL 1 MG/ML IJ SOLN: 0.2500 mg | INTRAMUSCULAR | Status: DC | PRN

## 2018-07-21 MED ORDER — OXYCODONE HCL 5 MG PO TABS: 5.0000 mg | ORAL_TABLET | Freq: Once | ORAL | Status: DC | PRN

## 2018-07-21 SURGICAL SUPPLY — 47 items
APPLIER CLIP 9.375 MED OPEN (MISCELLANEOUS)
BINDER BREAST MEDIUM (GAUZE/BANDAGES/DRESSINGS) ×2 IMPLANT
BLADE HEX COATED 2.75 (ELECTRODE) ×2 IMPLANT
BLADE SURG 15 STRL LF DISP TIS (BLADE) ×1 IMPLANT
BLADE SURG 15 STRL SS (BLADE) ×1
CANISTER SUC SOCK COL 7IN (MISCELLANEOUS) IMPLANT
CANISTER SUCT 1200ML W/VALVE (MISCELLANEOUS) IMPLANT
CHLORAPREP W/TINT 26ML (MISCELLANEOUS) ×2 IMPLANT
CLIP APPLIE 9.375 MED OPEN (MISCELLANEOUS) IMPLANT
CLIP VESOCCLUDE SM WIDE 6/CT (CLIP) ×2 IMPLANT
COVER BACK TABLE 60X90IN (DRAPES) ×2 IMPLANT
COVER MAYO STAND STRL (DRAPES) ×2 IMPLANT
COVER PROBE W GEL 5X96 (DRAPES) ×2 IMPLANT
COVER WAND RF STERILE (DRAPES) IMPLANT
DECANTER SPIKE VIAL GLASS SM (MISCELLANEOUS) IMPLANT
DERMABOND ADVANCED (GAUZE/BANDAGES/DRESSINGS) ×1
DERMABOND ADVANCED .7 DNX12 (GAUZE/BANDAGES/DRESSINGS) ×1 IMPLANT
DEVICE DUBIN W/COMP PLATE 8390 (MISCELLANEOUS) ×2 IMPLANT
DRAPE LAPAROSCOPIC ABDOMINAL (DRAPES) ×2 IMPLANT
DRAPE UTILITY XL STRL (DRAPES) ×2 IMPLANT
ELECT REM PT RETURN 9FT ADLT (ELECTROSURGICAL) ×2
ELECTRODE REM PT RTRN 9FT ADLT (ELECTROSURGICAL) ×1 IMPLANT
GAUZE SPONGE 4X4 12PLY STRL LF (GAUZE/BANDAGES/DRESSINGS) IMPLANT
GLOVE BIO SURGEON STRL SZ 6.5 (GLOVE) ×2 IMPLANT
GLOVE BIOGEL PI IND STRL 6.5 (GLOVE) ×1 IMPLANT
GLOVE BIOGEL PI INDICATOR 6.5 (GLOVE) ×1
GLOVE SURG SIGNA 7.5 PF LTX (GLOVE) ×2 IMPLANT
GOWN STRL REUS W/ TWL LRG LVL3 (GOWN DISPOSABLE) ×1 IMPLANT
GOWN STRL REUS W/ TWL XL LVL3 (GOWN DISPOSABLE) ×1 IMPLANT
GOWN STRL REUS W/TWL LRG LVL3 (GOWN DISPOSABLE) ×1
GOWN STRL REUS W/TWL XL LVL3 (GOWN DISPOSABLE) ×1
KIT MARKER MARGIN INK (KITS) ×2 IMPLANT
NEEDLE HYPO 25X1 1.5 SAFETY (NEEDLE) ×2 IMPLANT
NS IRRIG 1000ML POUR BTL (IV SOLUTION) IMPLANT
PACK BASIN DAY SURGERY FS (CUSTOM PROCEDURE TRAY) ×2 IMPLANT
PENCIL BUTTON HOLSTER BLD 10FT (ELECTRODE) ×2 IMPLANT
SLEEVE SCD COMPRESS KNEE MED (MISCELLANEOUS) ×2 IMPLANT
SPONGE LAP 4X18 RFD (DISPOSABLE) ×2 IMPLANT
SUT MNCRL AB 4-0 PS2 18 (SUTURE) ×2 IMPLANT
SUT SILK 2 0 SH (SUTURE) IMPLANT
SUT VIC AB 3-0 SH 27 (SUTURE) ×1
SUT VIC AB 3-0 SH 27X BRD (SUTURE) ×1 IMPLANT
SYR CONTROL 10ML LL (SYRINGE) ×2 IMPLANT
TOWEL GREEN STERILE FF (TOWEL DISPOSABLE) ×2 IMPLANT
TOWEL OR NON WOVEN STRL DISP B (DISPOSABLE) IMPLANT
TUBE CONNECTING 20X1/4 (TUBING) IMPLANT
YANKAUER SUCT BULB TIP NO VENT (SUCTIONS) IMPLANT

## 2018-07-21 NOTE — Anesthesia Procedure Notes (Signed)
Procedure Name: LMA Insertion Date/Time: 07/21/2018 8:23 AM Performed by: Signe Colt, CRNA Pre-anesthesia Checklist: Patient identified, Emergency Drugs available, Suction available and Patient being monitored Patient Re-evaluated:Patient Re-evaluated prior to induction Oxygen Delivery Method: Circle system utilized Preoxygenation: Pre-oxygenation with 100% oxygen Induction Type: IV induction Ventilation: Mask ventilation without difficulty LMA: LMA inserted LMA Size: 4.0 Number of attempts: 1 Airway Equipment and Method: Bite block Placement Confirmation: positive ETCO2 Tube secured with: Tape Dental Injury: Teeth and Oropharynx as per pre-operative assessment

## 2018-07-21 NOTE — Anesthesia Postprocedure Evaluation (Signed)
Anesthesia Post Note  Patient: Alisha [name]  Procedure(s) Performed: LEFT BREAST LUMPECTOMY WITH RADIOACTIVE SEED LOCALIZATION (Left Breast) EXCISION OF LEFT BREAST MASS (Left Breast)     Patient location during evaluation: PACU Anesthesia Type: General Level of consciousness: awake and alert Pain management: pain level controlled Vital Signs Assessment: post-procedure vital signs reviewed and stable Respiratory status: spontaneous breathing, nonlabored ventilation, respiratory function stable and patient connected to nasal cannula oxygen Cardiovascular status: blood pressure returned to baseline and stable Postop Assessment: no apparent nausea or vomiting Anesthetic complications: no    Last Vitals:  Vitals:   07/21/18 0930 07/21/18 0945  BP: 99/67 107/70  Pulse: 77 77  Resp: 12 16  Temp:  36.6 C  SpO2: 99% 100%    Last Pain:  Vitals:   07/21/18 0945  TempSrc:   PainSc: 0-No pain                 Alissandra Geoffroy P Lyric Rossano

## 2018-07-21 NOTE — Discharge Instructions (Signed)
[place] Office Phone Number [phone]  BREAST BIOPSY/ LUMPECTOMY: POST OP INSTRUCTIONS  Always review your discharge instruction sheet given to you by the facility where your surgery was performed.  IF YOU HAVE DISABILITY OR FAMILY LEAVE FORMS, YOU MUST BRING THEM TO THE OFFICE FOR PROCESSING.  DO NOT GIVE THEM TO YOUR DOCTOR.  1. A prescription for pain medication may be given to you upon discharge.  Take your pain medication as prescribed, if needed.  If narcotic pain medicine is not needed, then you may take acetaminophen (Tylenol) or ibuprofen (Advil) as needed. 2. Take your usually prescribed medications unless otherwise directed 3. If you need a refill on your pain medication, please contact your pharmacy.  They will contact our office to request authorization.  Prescriptions will not be filled after 5pm or on week-ends. 4. You should eat very light the first 24 hours after surgery, such as soup, crackers, pudding, etc.  Resume your normal diet the day after surgery. 5. Most patients will experience some swelling and bruising in the breast.  Ice packs and a good support bra will help.  Swelling and bruising can take several days to resolve.  6. It is common to experience some constipation if taking pain medication after surgery.  Increasing fluid intake and taking a stool softener will usually help or prevent this problem from occurring.  A mild laxative (Milk of Magnesia or Miralax) should be taken according to package directions if there are no bowel movements after 48 hours. 7. Unless discharge instructions indicate otherwise, you may remove your bandages 24-48 hours after surgery, and you may shower at that time.  You may have steri-strips (small skin tapes) in place directly over the incision.  These strips should be left on the skin for 7-10 days.  If your surgeon used skin glue on the incision, you may shower in 24 hours.  The glue will flake off over the next 2-3  weeks.  Any sutures or staples will be removed at the office during your follow-up visit. 8. ACTIVITIES:  You may resume regular daily activities (gradually increasing) beginning the next day.  Wearing a good support bra or sports bra minimizes pain and swelling.  You may have sexual intercourse when it is comfortable. a. You may drive when you no longer are taking prescription pain medication, you can comfortably wear a seatbelt, and you can safely maneuver your car and apply brakes. b. RETURN TO WORK:  ______________________________________________________________________________________ 9. You should see your doctor in the office for a follow-up appointment approximately two weeks after your surgery.  Your doctors nurse will typically make your follow-up appointment when she calls you with your pathology report.  Expect your pathology report 2-3 business days after your surgery.  You may call to check if you do not hear from [place] after three days. 10. OTHER INSTRUCTIONS:OK TO SHOWER STARTING TOMORROW 11. ICE PACK, TYLENOL, AND IBUPROFEN ALSO FOR PAIN 12. NO VIGOROUS ACTIVITY FOR ONE WEEK WEAR BREAST BINDER FOR COMFORT ONLY.  YOU MAY REMOVE IT WHEN YOU GET HOME IF IT IS UNCOMFORTABLE   WHEN TO CALL YOUR DOCTOR: 1. Fever over 101.0 2. Nausea and/or vomiting. 3. Extreme swelling or bruising. 4. Continued bleeding from incision. 5. Increased pain, redness, or drainage from the incision.  The clinic staff is available to answer your questions during regular business hours.  Please dont hesitate to call and ask to speak to one of the nurses for clinical concerns.  If you have  a medical emergency, go to the nearest emergency room or call 911.  A surgeon from [facility] is always on call at the hospital.  For further questions, please visit [email or web address]    Post Anesthesia Home Care Instructions  Activity: Get plenty of rest for the remainder of the day. A responsible  individual must stay with you for 24 hours following the procedure.  For the next 24 hours, DO NOT: -Drive a car -Paediatric nurse -Drink alcoholic beverages -Take any medication unless instructed by your physician -Make any legal decisions or sign important papers.  Meals: Start with liquid foods such as gelatin or soup. Progress to regular foods as tolerated. Avoid greasy, spicy, heavy foods. If nausea and/or vomiting occur, drink only clear liquids until the nausea and/or vomiting subsides. Call your physician if vomiting continues.  Special Instructions/Symptoms: Your throat may feel dry or sore from the anesthesia or the breathing tube placed in your throat during surgery. If this causes discomfort, gargle with warm salt water. The discomfort should disappear within 24 hours.  If you had a scopolamine patch placed behind your ear for the management of post- operative nausea and/or vomiting:  1. The medication in the patch is effective for 72 hours, after which it should be removed.  Wrap patch in a tissue and discard in the trash. Wash hands thoroughly with soap and water. 2. You may remove the patch earlier than 72 hours if you experience unpleasant side effects which may include dry mouth, dizziness or visual disturbances. 3. Avoid touching the patch. Wash your hands with soap and water after contact with the patch.

## 2018-07-21 NOTE — Op Note (Signed)
LEFT BREAST LUMPECTOMY WITH RADIOACTIVE SEED LOCALIZATION, EXCISION OF LEFT BREAST MASS  Procedure Note  Alisha [name] 07/21/2018   Pre-op Diagnosis: left breast lipoma and complex sclerosing lesion     Post-op Diagnosis: same  Procedure(s): LEFT BREAST LUMPECTOMY WITH RADIOACTIVE SEED LOCALIZATION EXCISION OF LEFT BREAST MASS  Surgeon(s): Coralie Keens, MD  Anesthesia: General  Staff:  Circulator: Izora Ribas, RN Scrub Person: Lorenza Burton, CST  Estimated Blood Loss: Minimal               Specimens: sent to path  Indications: This is [name] 52 year old female with [name] large palpable lipoma in the left breast as well as [name] complex sclerosing lesion seen and biopsied on recent mammogram.  The decision has been made to proceed with [name] radioactive seed guided lumpectomy to remove this complex sclerosing lesion and removal of the lipoma.  Procedure: The patient was brought to the operating room and identified the correct patient.  She is placed upon the operating table and general anesthesia was induced.  Her left breast was prepped and draped in usual sterile fashion.  Using the neoprobe, the radioactive seed was located just inferior to the areola at the 6 o'clock position.  The large palpable lipoma was just slightly further inferior and medial.  I anesthetized the skin at the edge of the areola with Marcaine.  I then made an incision with [name] scalpel.  I dissected down to the breast tissue with the cautery.  I then performed [name] lumpectomy with the aid of the neoprobe staying around the radioactive seed.  Once the specimen was completely removed, I checked the remaining breast with the neoprobe and found no uptake.  I painted the margins with paint.  An x-ray was performed on the specimen.  The radioactive seed and previous biopsy clip were in the specimen.  This was then sent to pathology for evaluation.  I then was able to easily palpate lipoma through this same incision and excise it  easily with the cautery.  This was also sent to pathology.  I achieved hemostasis with the cautery.  I anesthetized the wound further with Marcaine.  I placed [name] Ingal clip into the biopsy cavity for marker purposes.  I then closed the subcutaneous tissue with interrupted 3-0 Vicryl sutures and closed the skin with [name] running 4-0 Monocryl.  Dermabond was then applied.  The patient tolerated the procedure well.  All the counts were correct at the end of the procedure.  The patient was then extubated in the operating room and taken in [name] stable condition to the recovery room.          Alisha [name]   Date: 07/21/2018  Time: 8:57 AM

## 2018-07-21 NOTE — Anesthesia Preprocedure Evaluation (Addendum)
Anesthesia Evaluation  Patient identified by MRN, date of birth, ID band Patient awake    Reviewed: Allergy & Precautions, NPO status , Patient's Chart, lab work & pertinent test results  Airway Mallampati: II  TM Distance: >3 FB Neck ROM: Full    Dental no notable dental hx.    Pulmonary neg pulmonary ROS,    Pulmonary exam normal breath sounds clear to auscultation       Cardiovascular negative cardio ROS Normal cardiovascular exam Rhythm:Regular Rate:Normal     Neuro/Psych negative neurological ROS  negative psych ROS   GI/Hepatic negative GI ROS, Neg liver ROS,   Endo/Other  negative endocrine ROS  Renal/GU negative Renal ROS     Musculoskeletal negative musculoskeletal ROS (+)   Abdominal   Peds  Hematology negative hematology ROS (+)   Anesthesia Other Findings left breast lipoma and complex sclerosing lesion  Reproductive/Obstetrics                            Anesthesia Physical Anesthesia Plan  ASA: II  Anesthesia Plan: General   Post-op Pain Management:    Induction: Intravenous  PONV Risk Score and Plan: 3 and Midazolam, Dexamethasone, Ondansetron and Treatment may vary due to age or medical condition  Airway Management Planned: LMA  Additional Equipment:   Intra-op Plan:   Post-operative Plan: Extubation in OR  Informed Consent: I have reviewed the patients History and Physical, chart, labs and discussed the procedure including the risks, benefits and alternatives for the proposed anesthesia with the patient or authorized representative who has indicated his/her understanding and acceptance.   Dental advisory given  Plan Discussed with: CRNA  Anesthesia Plan Comments:        Anesthesia Quick Evaluation

## 2018-07-21 NOTE — Transfer of Care (Signed)
Immediate Anesthesia Transfer of Care Note  Patient: Alisha [name]  Procedure(s) Performed: LEFT BREAST LUMPECTOMY WITH RADIOACTIVE SEED LOCALIZATION (Left Breast) EXCISION OF LEFT BREAST MASS (Left Breast)  Patient Location: PACU  Anesthesia Type:General  Level of Consciousness: drowsy and patient cooperative  Airway & Oxygen Therapy: Patient Spontanous Breathing and Patient connected to face mask oxygen  Post-op Assessment: Report given to RN and Post -op Vital signs reviewed and stable  Post vital signs: Reviewed and stable  Last Vitals:  Vitals Value Taken Time  BP    Temp    Pulse    Resp    SpO2      Last Pain:  Vitals:   07/21/18 0706  TempSrc: Oral  PainSc: 0-No pain         Complications: No apparent anesthesia complications

## 2018-07-21 NOTE — Interval H&P Note (Signed)
History and Physical Interval Note:no change in H and P  07/21/2018 7:58 AM  Alisha [name]  has presented today for surgery, with the diagnosis of left breast lipoma and complex sclerosing lesion  The various methods of treatment have been discussed with the patient and family. After consideration of risks, benefits and other options for treatment, the patient has consented to  Procedure(s): LEFT BREAST LUMPECTOMY WITH RADIOACTIVE SEED LOCALIZATION (Left) EXCISION OF LEFT BREAST MASS (Left) as a surgical intervention .  The patient's history has been reviewed, patient examined, no change in status, stable for surgery.  I have reviewed the patient's chart and labs.  Questions were answered to the patient's satisfaction.     Shaynna Husby A

## 2018-07-22 ENCOUNTER — Encounter (HOSPITAL_BASED_OUTPATIENT_CLINIC_OR_DEPARTMENT_OTHER): Payer: Self-pay | Admitting: Surgery

## 2018-11-02 DIAGNOSIS — Z0000 Encounter for general adult medical examination without abnormal findings: Secondary | ICD-10-CM | POA: Diagnosis not present

## 2018-11-02 DIAGNOSIS — Z13220 Encounter for screening for lipoid disorders: Secondary | ICD-10-CM | POA: Diagnosis not present

## 2019-02-14 ENCOUNTER — Other Ambulatory Visit: Payer: Self-pay | Admitting: Family Medicine

## 2019-02-14 DIAGNOSIS — Z1231 Encounter for screening mammogram for malignant neoplasm of breast: Secondary | ICD-10-CM

## 2019-02-16 ENCOUNTER — Other Ambulatory Visit: Payer: Self-pay

## 2019-02-16 ENCOUNTER — Ambulatory Visit
Admission: RE | Admit: 2019-02-16 | Discharge: 2019-02-16 | Disposition: A | Discharge status: Home / Self Care | Payer: 59 | Source: Ambulatory Visit | Attending: Family Medicine | Admitting: Family Medicine

## 2019-02-16 DIAGNOSIS — Z1231 Encounter for screening mammogram for malignant neoplasm of breast: Secondary | ICD-10-CM

## 2019-04-19 IMAGING — MR MR BILATERAL BREAST WITHOUT AND WITH CONTRAST
5 of 9 series · 30 of 48 positions shown · IV contrast (multihance)
Comparison: Previous exam(s).

Addendum:
CLINICAL DATA: Strong family history for breast carcinoma. Recent
biopsy a left breast mass, pathology revealing fibrocystic changes.

LABS:  Not drawn at time of imaging.
EXAM:
BILATERAL BREAST MRI WITH AND WITHOUT CONTRAST
TECHNIQUE: Multiplanar, multisequence MR images of both breasts were obtained
prior to and following the intravenous administration of 10 ml of
MultiHance.

[Series 5: t2_tirm_tra ipat · axial · 3.0mm · 0.62mm/px · z∈[-89,+103]mm · 4 of 65 slices shown]
[im 1/65]
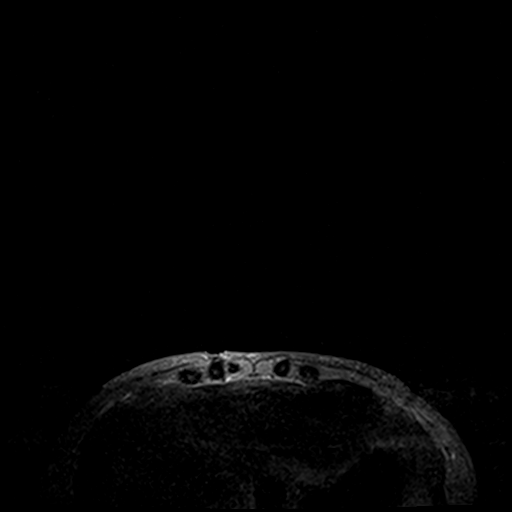
[im 22/65]
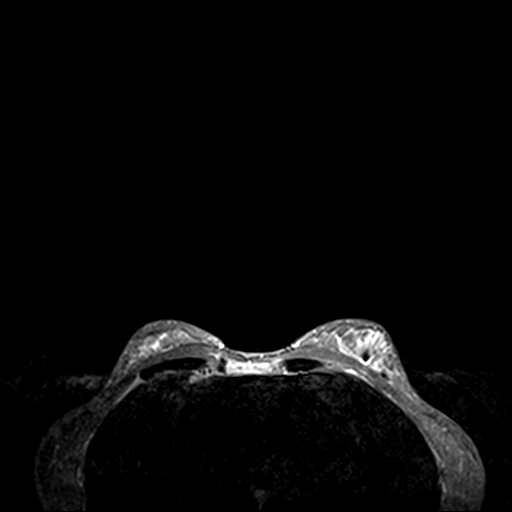
[im 43/65]
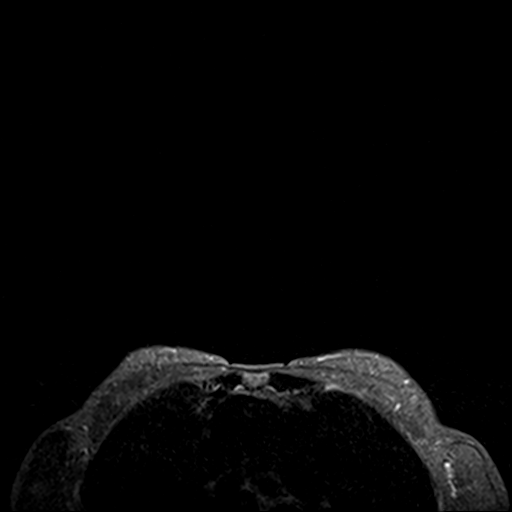
[im 65/65]
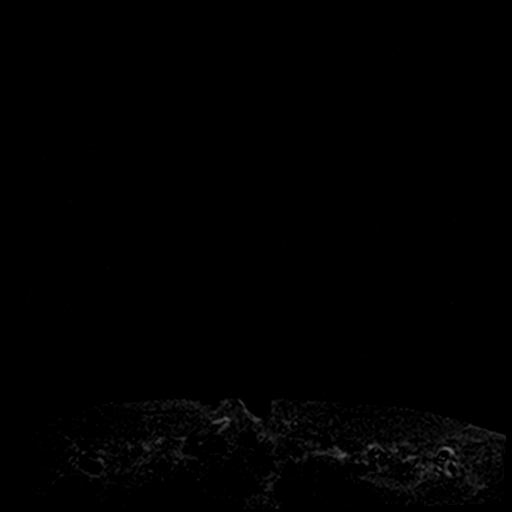

[Series 7: fl3d pre-cm · axial · non-contrast · 1.2mm · 0.94mm/px · z∈[-88,+103]mm · 7 of 160 slices shown]
[im 1/160]
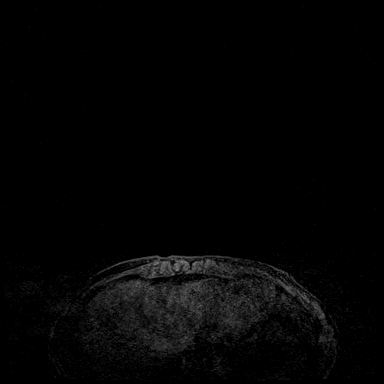
[im 27/160]
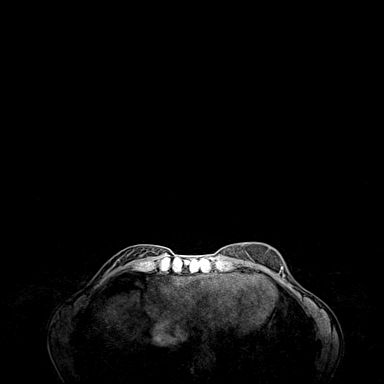
[im 54/160]
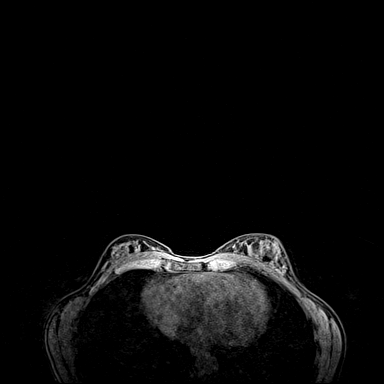
[im 80/160]
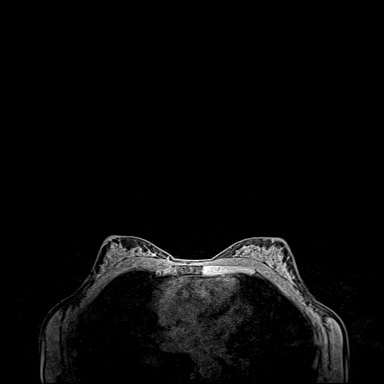
[im 107/160]
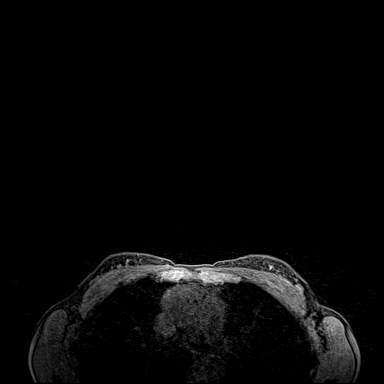
[im 133/160]
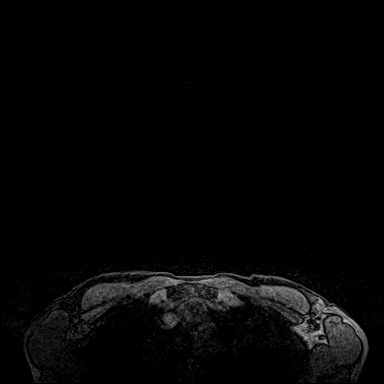
[im 160/160]
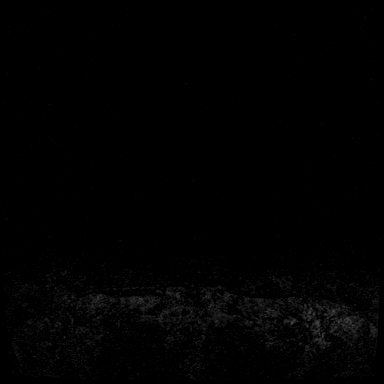

[Series 8: fl3d post immediate · axial · 1.2mm · 0.94mm/px · z∈[-88,+103]mm · 7 of 160 slices shown (1 of 2)]
[im 1/160]
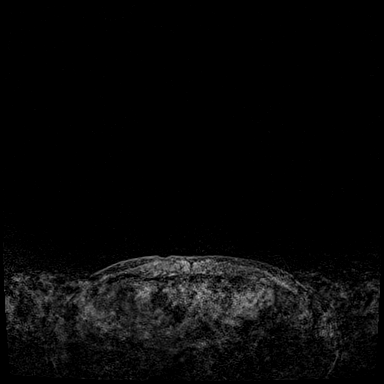
[im 27/160]
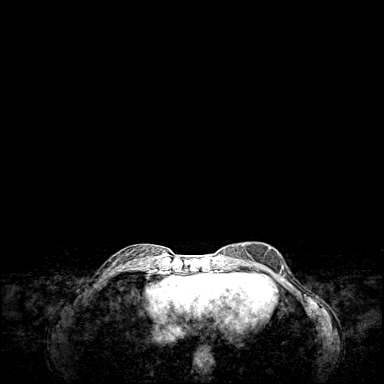
[im 54/160]
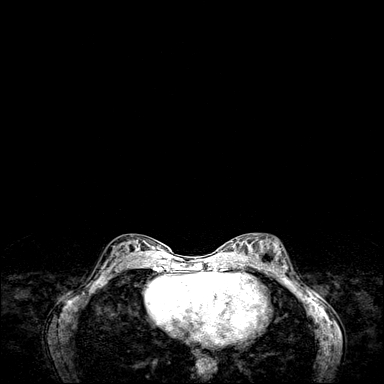
[im 80/160]
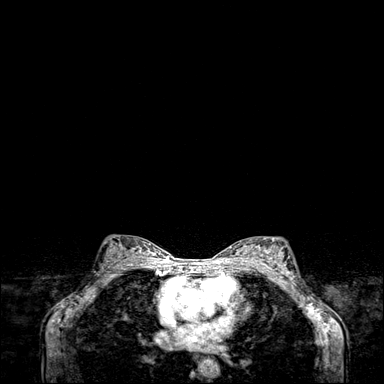
[im 107/160]
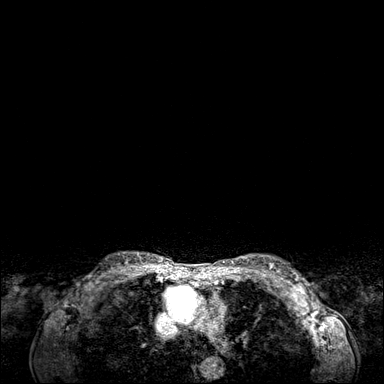
[im 133/160]
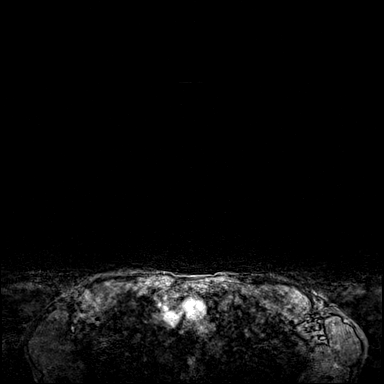
[im 160/160]
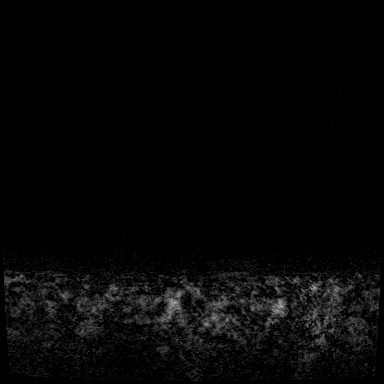

[Series 9: fl3d post immediate · axial · 1.2mm · 0.94mm/px · z∈[-88,+103]mm · 7 of 160 slices shown (2 of 2)]
[im 1/160]
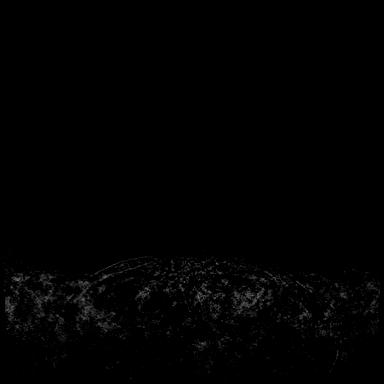
[im 27/160]
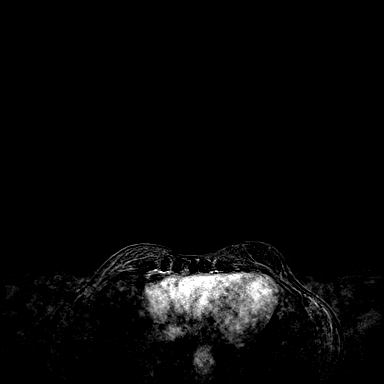
[im 54/160]
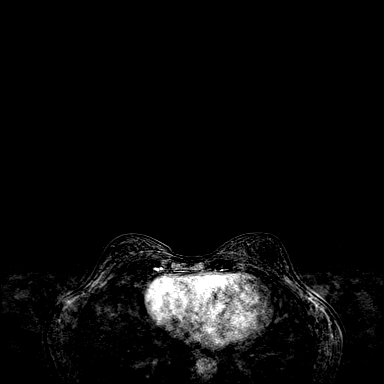
[im 80/160]
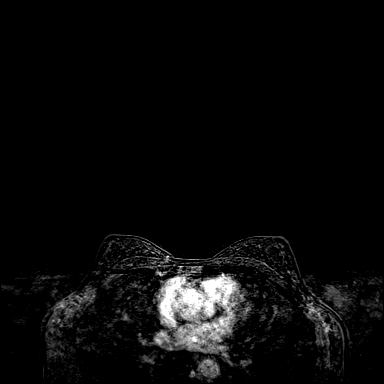
[im 107/160]
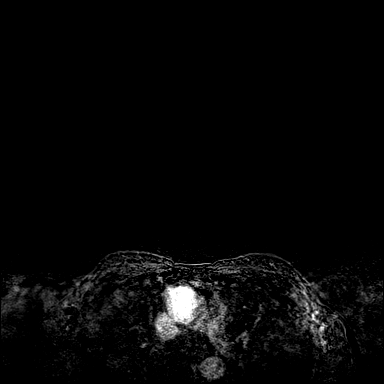
[im 133/160]
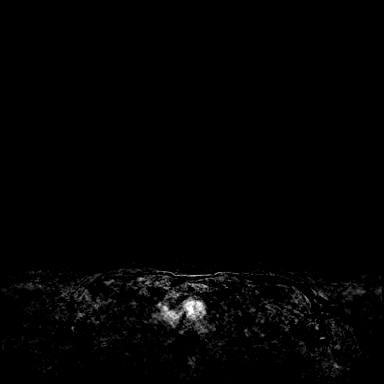
[im 160/160]
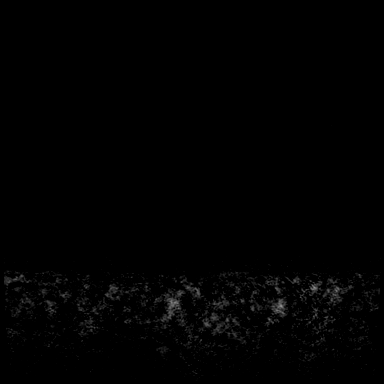

[Series 11: fl3d post 3min · axial · 1.2mm · 0.94mm/px · z∈[-88,+39]mm · 5 of 160 slices shown]
[im 1/160]
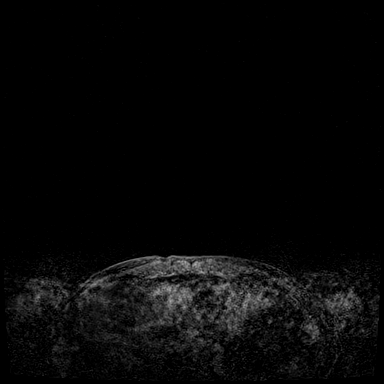
[im 27/160]
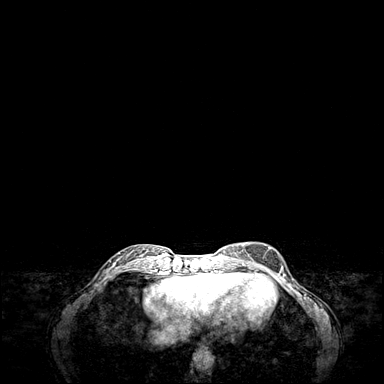
[im 54/160]
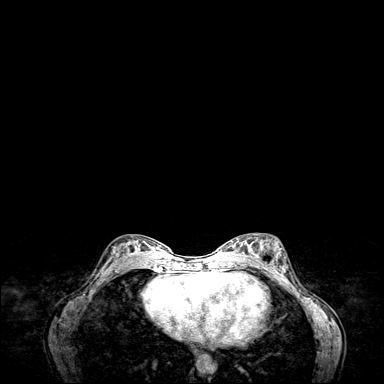
[im 80/160]
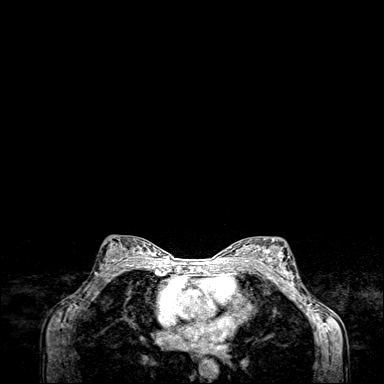
[im 107/160]
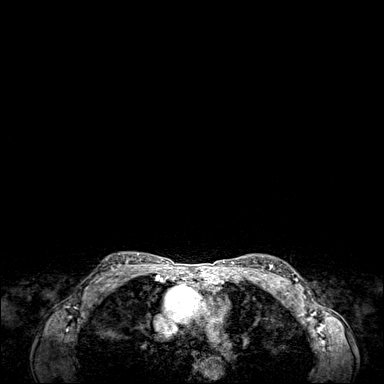

[30 of 48 positions shown; findings below may reference images not displayed]

Three-dimensional MR images were rendered by post-processing of the
original MR data on an independent workstation. The
three-dimensional MR images were interpreted, and findings are
reported in the following complete MRI report for this study. Three
dimensional images were evaluated at the independent [employer]
workstation
FINDINGS: Breast composition: c. Heterogeneous fibroglandular tissue.

Background parenchymal enhancement: Moderate.

Right breast: No mass or abnormal enhancement.

Left breast: No mass or abnormal enhancement. Artifact from 2 biopsy
clips, 1 inferior and 1 superior.

Lymph nodes: No abnormal appearing lymph nodes.

Ancillary findings:  None.
IMPRESSION: 1. No evidence of breast malignancy.

RECOMMENDATION:
1.  Screening mammogram in one year.(Code:[id number])

2. Annual breast MRI recommended given the patient's increased risk
breast carcinoma based on family history and her heterogeneously
dense breast mammographically.

BI-RADS CATEGORY  1: Negative.

ADDENDUM:
1. Patient had a second biopsy of her left breast, which revealed a
complex sclerosing lesion. She is scheduled for surgical
consultation for excision. Assuming excision yields no upgrade in
the pathologic results, her follow-up would be screening mammography
in December 2018.

*** End of Addendum ***

## 2019-11-28 ENCOUNTER — Other Ambulatory Visit: Payer: Self-pay | Admitting: Family Medicine

## 2019-11-28 DIAGNOSIS — I259 Chronic ischemic heart disease, unspecified: Secondary | ICD-10-CM

## 2019-12-16 ENCOUNTER — Other Ambulatory Visit: Payer: 59

## 2020-01-03 ENCOUNTER — Ambulatory Visit
Admission: RE | Admit: 2020-01-03 | Discharge: 2020-01-03 | Disposition: A | Discharge status: Home / Self Care | Payer: No Typology Code available for payment source | Source: Ambulatory Visit | Attending: Family Medicine | Admitting: Family Medicine

## 2020-01-03 DIAGNOSIS — I259 Chronic ischemic heart disease, unspecified: Secondary | ICD-10-CM

## 2020-01-31 ENCOUNTER — Other Ambulatory Visit: Payer: Self-pay | Admitting: Family Medicine

## 2020-01-31 DIAGNOSIS — Z1231 Encounter for screening mammogram for malignant neoplasm of breast: Secondary | ICD-10-CM

## 2020-02-24 ENCOUNTER — Other Ambulatory Visit: Payer: Self-pay

## 2020-02-24 ENCOUNTER — Ambulatory Visit
Admission: RE | Admit: 2020-02-24 | Discharge: 2020-02-24 | Disposition: A | Discharge status: Home / Self Care | Payer: 59 | Source: Ambulatory Visit | Attending: Family Medicine | Admitting: Family Medicine

## 2020-02-24 DIAGNOSIS — Z1231 Encounter for screening mammogram for malignant neoplasm of breast: Secondary | ICD-10-CM

## 2020-02-29 ENCOUNTER — Other Ambulatory Visit: Payer: Self-pay | Admitting: Family Medicine

## 2020-02-29 DIAGNOSIS — R928 Other abnormal and inconclusive findings on diagnostic imaging of breast: Secondary | ICD-10-CM

## 2020-03-08 ENCOUNTER — Ambulatory Visit
Admission: RE | Admit: 2020-03-08 | Discharge: 2020-03-08 | Disposition: A | Discharge status: Home / Self Care | Payer: 59 | Source: Ambulatory Visit | Attending: Family Medicine | Admitting: Family Medicine

## 2020-03-08 ENCOUNTER — Other Ambulatory Visit: Payer: Self-pay

## 2020-03-08 ENCOUNTER — Other Ambulatory Visit: Payer: Self-pay | Admitting: Family Medicine

## 2020-03-08 DIAGNOSIS — R928 Other abnormal and inconclusive findings on diagnostic imaging of breast: Secondary | ICD-10-CM

## 2020-03-08 DIAGNOSIS — N6310 Unspecified lump in the right breast, unspecified quadrant: Secondary | ICD-10-CM

## 2020-03-13 ENCOUNTER — Ambulatory Visit
Admission: RE | Admit: 2020-03-13 | Discharge: 2020-03-13 | Disposition: A | Discharge status: Home / Self Care | Payer: 59 | Source: Ambulatory Visit | Attending: Family Medicine | Admitting: Family Medicine

## 2020-03-13 DIAGNOSIS — N6310 Unspecified lump in the right breast, unspecified quadrant: Secondary | ICD-10-CM

## 2020-03-15 HISTORY — PX: BREAST BIOPSY: SHX20

## 2020-04-13 ENCOUNTER — Other Ambulatory Visit: Payer: Self-pay | Admitting: Surgery

## 2020-04-13 DIAGNOSIS — N6310 Unspecified lump in the right breast, unspecified quadrant: Secondary | ICD-10-CM

## 2020-04-18 ENCOUNTER — Other Ambulatory Visit: Payer: Self-pay

## 2020-04-18 ENCOUNTER — Ambulatory Visit (HOSPITAL_COMMUNITY)
Admission: RE | Admit: 2020-04-18 | Discharge: 2020-04-18 | Disposition: A | Discharge status: Home / Self Care | Payer: 59 | Source: Ambulatory Visit | Attending: Surgery | Admitting: Surgery

## 2020-04-18 DIAGNOSIS — N6310 Unspecified lump in the right breast, unspecified quadrant: Secondary | ICD-10-CM | POA: Diagnosis present

## 2020-04-18 MED ORDER — GADOBUTROL 1 MMOL/ML IV SOLN
5.0000 mL | Freq: Once | INTRAVENOUS | Status: AC | PRN
  Administered 2020-04-18: 5 mL via INTRAVENOUS

## 2020-04-24 ENCOUNTER — Other Ambulatory Visit: Payer: Self-pay | Admitting: Surgery

## 2020-04-24 DIAGNOSIS — R9389 Abnormal findings on diagnostic imaging of other specified body structures: Secondary | ICD-10-CM

## 2020-05-02 ENCOUNTER — Other Ambulatory Visit: Payer: Self-pay

## 2020-05-02 ENCOUNTER — Other Ambulatory Visit: Payer: Self-pay | Admitting: Body Imaging

## 2020-05-02 ENCOUNTER — Ambulatory Visit
Admission: RE | Admit: 2020-05-02 | Discharge: 2020-05-02 | Disposition: A | Discharge status: Home / Self Care | Payer: 59 | Source: Ambulatory Visit | Attending: Surgery | Admitting: Surgery

## 2020-05-02 ENCOUNTER — Other Ambulatory Visit: Payer: Self-pay | Admitting: Surgery

## 2020-05-02 DIAGNOSIS — R9389 Abnormal findings on diagnostic imaging of other specified body structures: Secondary | ICD-10-CM

## 2020-05-02 DIAGNOSIS — N6310 Unspecified lump in the right breast, unspecified quadrant: Secondary | ICD-10-CM

## 2020-05-02 HISTORY — PX: BREAST BIOPSY: SHX20

## 2020-05-02 MED ORDER — GADOBUTROL 1 MMOL/ML IV SOLN
5.0000 mL | Freq: Once | INTRAVENOUS | Status: AC | PRN
  Administered 2020-05-02: 5 mL via INTRAVENOUS

## 2020-05-16 ENCOUNTER — Other Ambulatory Visit: Payer: Self-pay | Admitting: Surgery

## 2020-05-24 ENCOUNTER — Encounter (HOSPITAL_BASED_OUTPATIENT_CLINIC_OR_DEPARTMENT_OTHER): Payer: Self-pay | Admitting: Surgery

## 2020-05-24 ENCOUNTER — Other Ambulatory Visit: Payer: Self-pay

## 2020-05-27 ENCOUNTER — Other Ambulatory Visit: Payer: Self-pay | Admitting: Surgery

## 2020-05-27 DIAGNOSIS — N6021 Fibroadenosis of right breast: Secondary | ICD-10-CM

## 2020-05-27 DIAGNOSIS — N6310 Unspecified lump in the right breast, unspecified quadrant: Secondary | ICD-10-CM

## 2020-05-28 ENCOUNTER — Other Ambulatory Visit (HOSPITAL_COMMUNITY)
Admission: RE | Admit: 2020-05-28 | Discharge: 2020-05-28 | Disposition: A | Discharge status: Home / Self Care | Payer: 59 | Source: Ambulatory Visit | Attending: Surgery | Admitting: Surgery

## 2020-05-28 DIAGNOSIS — Z20822 Contact with and (suspected) exposure to covid-19: Secondary | ICD-10-CM | POA: Insufficient documentation

## 2020-05-28 DIAGNOSIS — Z01812 Encounter for preprocedural laboratory examination: Secondary | ICD-10-CM | POA: Insufficient documentation

## 2020-05-28 LAB — SARS CORONAVIRUS 2 (TAT 6-24 HRS): SARS Coronavirus 2: NEGATIVE

## 2020-05-30 ENCOUNTER — Ambulatory Visit
Admission: RE | Admit: 2020-05-30 | Discharge: 2020-05-30 | Disposition: A | Discharge status: Home / Self Care | Payer: 59 | Source: Ambulatory Visit | Attending: Surgery | Admitting: Surgery

## 2020-05-30 ENCOUNTER — Other Ambulatory Visit: Payer: Self-pay | Admitting: Surgery

## 2020-05-30 ENCOUNTER — Other Ambulatory Visit: Payer: Self-pay

## 2020-05-30 DIAGNOSIS — N6310 Unspecified lump in the right breast, unspecified quadrant: Secondary | ICD-10-CM

## 2020-05-30 DIAGNOSIS — N6021 Fibroadenosis of right breast: Secondary | ICD-10-CM

## 2020-05-30 MED ORDER — CHLORHEXIDINE GLUCONATE CLOTH 2 % EX PADS: 6.0000 | MEDICATED_PAD | Freq: Once | CUTANEOUS | Status: DC

## 2020-05-30 NOTE — Progress Notes (Signed)

## 2020-05-30 NOTE — H&P (Signed)
   Alisha [name]  Location: [facility] Patient #: [id number] DOB: 1966/06/24 Married / Language: English / Race: White Female   History of Present Illness  The patient is a 54 year old female who presents with a breast mass.  Chief complaint: Right breast mass  This is a 54 year old female has been admitted from a previous left breast surgery. She had a left breast papilloma. Performed a radioactive seed that a lumpectomy in 2019. On recent screening mammography this year she was found to have a possible mass at the 12 o'clock position of the right breast. The mass measured 0.4 cm in size. She underwent biopsy showing a papillary lesion. Because of her breast density and size, the radiologists have a difficult time placing the postbiopsy clip for marking purposes. They recommended an MRI of her breasts preoperatively to help with clip placement.   Allergies Sabino Gasser, CMA; Sulfa Antibiotics  Rash. Allergies Reconciled   Medication History Sabino Gasser, CMA Caltrate 600 (Oral) Specific strength unknown - Active. One-A-Day Womens (Oral) Active. Medications Reconciled  Vitals Sabino Gasser CMA  Weight: 114.4 lb Height: 65in Body Surface Area: 1.56 m Body Mass Index: 19.04 kg/m  Temp.: 97.52F (Tympanic)  Pulse: 84 (Regular)  BP: 110/72(Sitting, Left Arm, Standard)     Physical Exam  The physical exam findings are as follows: Note: She appears well and exam  Breast small. There are no palpable masses in either breast and no axillary adenopathy.    Assessment & Plan  BREAST MASS, RIGHT (N63.10)  Impression: I reviewed her mammogram, ultrasound, and biopsy results. I gave her a copy of the biopsy results. This is a patient with a right breast mass showing papillary features on biopsy. Right breast reactive seed lumpectomy is recommended first flushed evaluation for malignancy. Again, radiologist breast and recommended an MRI  because of the difficulty of placing any clips total dose to proceed with breast conservation. The lesion is not palpable. She also has dense, small breasts. We are trying to avoid any cosmetic defect given the small size of her breasts. I discussed the surgical procedure in detail which she is well aware of her previous similar surgery on the left breast. We again discussed the risk in detail. She will be scheduled for an MRI and then surgery to follow.  Addendum: Her MRI showed 2 lesions in the right breast which were biopsied showed complex sclerosing lesions.  All 3 suspicious areas will be removed in the operating room with radioactive seed guidance via right breast radioactive seed guided lumpectomy

## 2020-05-31 ENCOUNTER — Ambulatory Visit
Admission: RE | Admit: 2020-05-31 | Discharge: 2020-05-31 | Disposition: A | Discharge status: Home / Self Care | Payer: 59 | Source: Ambulatory Visit | Attending: Surgery | Admitting: Surgery

## 2020-05-31 ENCOUNTER — Encounter (HOSPITAL_BASED_OUTPATIENT_CLINIC_OR_DEPARTMENT_OTHER): Payer: Self-pay | Admitting: Surgery

## 2020-05-31 ENCOUNTER — Ambulatory Visit
Admit: 2020-05-31 | Discharge: 2020-05-31 | Disposition: A | Discharge status: Home / Self Care | Payer: 59 | Attending: Surgery | Admitting: Surgery

## 2020-05-31 ENCOUNTER — Other Ambulatory Visit: Payer: Self-pay

## 2020-05-31 ENCOUNTER — Ambulatory Visit (HOSPITAL_BASED_OUTPATIENT_CLINIC_OR_DEPARTMENT_OTHER)
Admission: RE | Admit: 2020-05-31 | Discharge: 2020-05-31 | Disposition: A | Discharge status: Home / Self Care | Payer: 59 | Attending: Surgery | Admitting: Surgery

## 2020-05-31 ENCOUNTER — Ambulatory Visit (HOSPITAL_BASED_OUTPATIENT_CLINIC_OR_DEPARTMENT_OTHER): Payer: 59 | Admitting: Anesthesiology

## 2020-05-31 ENCOUNTER — Encounter (HOSPITAL_BASED_OUTPATIENT_CLINIC_OR_DEPARTMENT_OTHER)
Admission: RE | Disposition: A | Discharge status: Home / Self Care | Payer: Self-pay | Source: Home / Self Care | Attending: Surgery

## 2020-05-31 DIAGNOSIS — N62 Hypertrophy of breast: Secondary | ICD-10-CM | POA: Diagnosis not present

## 2020-05-31 DIAGNOSIS — Z882 Allergy status to sulfonamides status: Secondary | ICD-10-CM | POA: Diagnosis not present

## 2020-05-31 DIAGNOSIS — D241 Benign neoplasm of right breast: Secondary | ICD-10-CM | POA: Insufficient documentation

## 2020-05-31 DIAGNOSIS — N6310 Unspecified lump in the right breast, unspecified quadrant: Secondary | ICD-10-CM

## 2020-05-31 HISTORY — PX: BREAST LUMPECTOMY WITH RADIOACTIVE SEED LOCALIZATION: SHX6424

## 2020-05-31 HISTORY — PX: BREAST EXCISIONAL BIOPSY: SUR124

## 2020-05-31 LAB — POCT PREGNANCY, URINE: Preg Test, Ur: NEGATIVE

## 2020-05-31 SURGERY — BREAST LUMPECTOMY WITH RADIOACTIVE SEED LOCALIZATION
Anesthesia: General | Site: Breast | Laterality: Right

## 2020-05-31 MED ORDER — OXYCODONE HCL 5 MG/5ML PO SOLN: 5.0000 mg | Freq: Once | ORAL | Status: DC | PRN

## 2020-05-31 MED ORDER — FENTANYL CITRATE (PF) 100 MCG/2ML IJ SOLN
INTRAMUSCULAR | Status: DC | PRN
Start: 2020-05-31 — End: 2020-05-31
  Administered 2020-05-31: 50 ug via INTRAVENOUS

## 2020-05-31 MED ORDER — BUPIVACAINE HCL (PF) 0.5 % IJ SOLN
INTRAMUSCULAR | Status: DC | PRN
  Administered 2020-05-31: 10 mL

## 2020-05-31 MED ORDER — EPHEDRINE 5 MG/ML INJ
INTRAVENOUS | Status: AC
  Filled 2020-05-31: qty 10

## 2020-05-31 MED ORDER — FENTANYL CITRATE (PF) 100 MCG/2ML IJ SOLN
INTRAMUSCULAR | Status: AC
  Filled 2020-05-31: qty 2

## 2020-05-31 MED ORDER — ACETAMINOPHEN 500 MG PO TABS
ORAL_TABLET | ORAL | Status: AC
  Filled 2020-05-31: qty 2

## 2020-05-31 MED ORDER — PROPOFOL 10 MG/ML IV BOLUS
INTRAVENOUS | Status: AC
  Filled 2020-05-31: qty 20

## 2020-05-31 MED ORDER — ONDANSETRON HCL 4 MG/2ML IJ SOLN
INTRAMUSCULAR | Status: DC | PRN
  Administered 2020-05-31: 4 mg via INTRAVENOUS

## 2020-05-31 MED ORDER — LIDOCAINE 2% (20 MG/ML) 5 ML SYRINGE
INTRAMUSCULAR | Status: AC
  Filled 2020-05-31: qty 5

## 2020-05-31 MED ORDER — PROPOFOL 10 MG/ML IV BOLUS
INTRAVENOUS | Status: DC | PRN
  Administered 2020-05-31: 150 mg via INTRAVENOUS

## 2020-05-31 MED ORDER — CHLORHEXIDINE GLUCONATE CLOTH 2 % EX PADS: 6.0000 | MEDICATED_PAD | Freq: Once | CUTANEOUS | Status: DC

## 2020-05-31 MED ORDER — FENTANYL CITRATE (PF) 100 MCG/2ML IJ SOLN: 25.0000 ug | INTRAMUSCULAR | Status: DC | PRN

## 2020-05-31 MED ORDER — TRAMADOL HCL 50 MG PO TABS: 50.0000 mg | ORAL_TABLET | Freq: Four times a day (QID) | ORAL | 0 refills | Status: AC | PRN

## 2020-05-31 MED ORDER — DEXAMETHASONE SODIUM PHOSPHATE 10 MG/ML IJ SOLN
INTRAMUSCULAR | Status: DC | PRN
  Administered 2020-05-31: 10 mg via INTRAVENOUS

## 2020-05-31 MED ORDER — CEFAZOLIN SODIUM-DEXTROSE 2-4 GM/100ML-% IV SOLN
2.0000 g | INTRAVENOUS | Status: AC
  Administered 2020-05-31: 2 g via INTRAVENOUS

## 2020-05-31 MED ORDER — GABAPENTIN 300 MG PO CAPS
ORAL_CAPSULE | ORAL | Status: AC
  Filled 2020-05-31: qty 1

## 2020-05-31 MED ORDER — ACETAMINOPHEN 500 MG PO TABS
1000.0000 mg | ORAL_TABLET | ORAL | Status: AC
  Administered 2020-05-31: 1000 mg via ORAL

## 2020-05-31 MED ORDER — OXYCODONE HCL 5 MG PO TABS: 5.0000 mg | ORAL_TABLET | Freq: Once | ORAL | Status: DC | PRN

## 2020-05-31 MED ORDER — GABAPENTIN 300 MG PO CAPS
300.0000 mg | ORAL_CAPSULE | ORAL | Status: AC
  Administered 2020-05-31: 300 mg via ORAL

## 2020-05-31 MED ORDER — MIDAZOLAM HCL 2 MG/2ML IJ SOLN
INTRAMUSCULAR | Status: DC | PRN
  Administered 2020-05-31: 2 mg via INTRAVENOUS

## 2020-05-31 MED ORDER — LACTATED RINGERS IV SOLN: INTRAVENOUS | Status: DC

## 2020-05-31 MED ORDER — ONDANSETRON HCL 4 MG/2ML IJ SOLN
INTRAMUSCULAR | Status: AC
  Filled 2020-05-31: qty 2

## 2020-05-31 MED ORDER — ACETAMINOPHEN 325 MG PO TABS: 325.0000 mg | ORAL_TABLET | ORAL | Status: DC | PRN

## 2020-05-31 MED ORDER — DEXAMETHASONE SODIUM PHOSPHATE 10 MG/ML IJ SOLN
INTRAMUSCULAR | Status: AC
  Filled 2020-05-31: qty 2

## 2020-05-31 MED ORDER — MIDAZOLAM HCL 2 MG/2ML IJ SOLN
INTRAMUSCULAR | Status: AC
  Filled 2020-05-31: qty 2

## 2020-05-31 MED ORDER — EPHEDRINE SULFATE 50 MG/ML IJ SOLN
INTRAMUSCULAR | Status: DC | PRN
  Administered 2020-05-31: 15 mg via INTRAVENOUS

## 2020-05-31 MED ORDER — ACETAMINOPHEN 160 MG/5ML PO SOLN: 325.0000 mg | ORAL | Status: DC | PRN

## 2020-05-31 MED ORDER — MEPERIDINE HCL 25 MG/ML IJ SOLN: 6.2500 mg | INTRAMUSCULAR | Status: DC | PRN

## 2020-05-31 MED ORDER — ONDANSETRON HCL 4 MG/2ML IJ SOLN: 4.0000 mg | Freq: Once | INTRAMUSCULAR | Status: DC | PRN

## 2020-05-31 MED ORDER — LIDOCAINE 2% (20 MG/ML) 5 ML SYRINGE
INTRAMUSCULAR | Status: DC | PRN
  Administered 2020-05-31: 50 mg via INTRAVENOUS

## 2020-05-31 MED ORDER — CEFAZOLIN SODIUM-DEXTROSE 2-4 GM/100ML-% IV SOLN
INTRAVENOUS | Status: AC
  Filled 2020-05-31: qty 100

## 2020-05-31 SURGICAL SUPPLY — 52 items
ADH SKN CLS APL DERMABOND .7 (GAUZE/BANDAGES/DRESSINGS) ×1
APL PRP STRL LF DISP 70% ISPRP (MISCELLANEOUS) ×1
APPLIER CLIP 9.375 MED OPEN (MISCELLANEOUS)
APR CLP MED 9.3 20 MLT OPN (MISCELLANEOUS)
BINDER BREAST 3XL (GAUZE/BANDAGES/DRESSINGS) IMPLANT
BINDER BREAST LRG (GAUZE/BANDAGES/DRESSINGS) IMPLANT
BINDER BREAST MEDIUM (GAUZE/BANDAGES/DRESSINGS) IMPLANT
BINDER BREAST XLRG (GAUZE/BANDAGES/DRESSINGS) IMPLANT
BINDER BREAST XXLRG (GAUZE/BANDAGES/DRESSINGS) IMPLANT
BLADE SURG 15 STRL LF DISP TIS (BLADE) ×1 IMPLANT
BLADE SURG 15 STRL SS (BLADE) ×2
CANISTER SUC SOCK COL 7IN (MISCELLANEOUS) IMPLANT
CANISTER SUCT 1200ML W/VALVE (MISCELLANEOUS) IMPLANT
CHLORAPREP W/TINT 26 (MISCELLANEOUS) ×2 IMPLANT
CLIP APPLIE 9.375 MED OPEN (MISCELLANEOUS) IMPLANT
COVER BACK TABLE 60X90IN (DRAPES) ×2 IMPLANT
COVER MAYO STAND STRL (DRAPES) ×2 IMPLANT
COVER PROBE W GEL 5X96 (DRAPES) ×2 IMPLANT
COVER WAND RF STERILE (DRAPES) IMPLANT
DECANTER SPIKE VIAL GLASS SM (MISCELLANEOUS) IMPLANT
DERMABOND ADVANCED (GAUZE/BANDAGES/DRESSINGS) ×1
DERMABOND ADVANCED .7 DNX12 (GAUZE/BANDAGES/DRESSINGS) ×1 IMPLANT
DRAPE LAPAROSCOPIC ABDOMINAL (DRAPES) ×2 IMPLANT
DRAPE UTILITY XL STRL (DRAPES) ×2 IMPLANT
ELECT REM PT RETURN 9FT ADLT (ELECTROSURGICAL) ×2
ELECTRODE REM PT RTRN 9FT ADLT (ELECTROSURGICAL) ×1 IMPLANT
GAUZE SPONGE 4X4 12PLY STRL LF (GAUZE/BANDAGES/DRESSINGS) IMPLANT
GLOVE BIO SURGEON STRL SZ 6.5 (GLOVE) ×2 IMPLANT
GLOVE BIO SURGEON STRL SZ7 (GLOVE) ×2 IMPLANT
GLOVE BIOGEL PI IND STRL 7.0 (GLOVE) ×3 IMPLANT
GLOVE BIOGEL PI INDICATOR 7.0 (GLOVE) ×3
GLOVE SURG SIGNA 7.5 PF LTX (GLOVE) ×2 IMPLANT
GOWN STRL REUS W/ TWL LRG LVL3 (GOWN DISPOSABLE) ×2 IMPLANT
GOWN STRL REUS W/ TWL XL LVL3 (GOWN DISPOSABLE) ×1 IMPLANT
GOWN STRL REUS W/TWL LRG LVL3 (GOWN DISPOSABLE) ×4
GOWN STRL REUS W/TWL XL LVL3 (GOWN DISPOSABLE) ×2
KIT MARKER MARGIN INK (KITS) ×2 IMPLANT
NEEDLE HYPO 25X1 1.5 SAFETY (NEEDLE) ×2 IMPLANT
NS IRRIG 1000ML POUR BTL (IV SOLUTION) IMPLANT
PACK BASIN DAY SURGERY FS (CUSTOM PROCEDURE TRAY) ×2 IMPLANT
PENCIL SMOKE EVACUATOR (MISCELLANEOUS) ×2 IMPLANT
SLEEVE SCD COMPRESS KNEE MED (MISCELLANEOUS) ×2 IMPLANT
SPONGE LAP 4X18 RFD (DISPOSABLE) ×2 IMPLANT
SUT MNCRL AB 4-0 PS2 18 (SUTURE) ×2 IMPLANT
SUT SILK 2 0 SH (SUTURE) IMPLANT
SUT VIC AB 3-0 SH 27 (SUTURE) ×2
SUT VIC AB 3-0 SH 27X BRD (SUTURE) ×1 IMPLANT
SYR CONTROL 10ML LL (SYRINGE) ×2 IMPLANT
TOWEL GREEN STERILE FF (TOWEL DISPOSABLE) ×2 IMPLANT
TRAY FAXITRON CT DISP (TRAY / TRAY PROCEDURE) ×4 IMPLANT
TUBE CONNECTING 20X1/4 (TUBING) IMPLANT
YANKAUER SUCT BULB TIP NO VENT (SUCTIONS) IMPLANT

## 2020-05-31 NOTE — Anesthesia Postprocedure Evaluation (Signed)
Anesthesia Post Note  Patient: Alisha [name]  Procedure(s) Performed: RIGHT BREAST LUMPECTOMY WITH RADIOACTIVE SEEDS X2 LOCALIZATION (Right Breast)     Patient location during evaluation: PACU Anesthesia Type: General Level of consciousness: awake and alert Pain management: pain level controlled Vital Signs Assessment: post-procedure vital signs reviewed and stable Respiratory status: spontaneous breathing, nonlabored ventilation, respiratory function stable and patient connected to nasal cannula oxygen Cardiovascular status: blood pressure returned to baseline and stable Postop Assessment: no apparent nausea or vomiting Anesthetic complications: no   No complications documented.  Last Vitals:  Vitals:   05/31/20 1245 05/31/20 1300  BP: 106/64 104/72  Pulse: 78 72  Resp: 13 17  Temp: (!) 36.2 C   SpO2: 100% 99%    Last Pain:  Vitals:   05/31/20 1245  PainSc: Asleep                 Dennis Hegeman

## 2020-05-31 NOTE — Interval H&P Note (Signed)
History and Physical Interval Note: no change in H and P  05/31/2020 11:30 AM  [name]  has presented today for surgery, with the diagnosis of RIGHT BREAST MASS.  The various methods of treatment have been discussed with the patient and family. After consideration of risks, benefits and other options for treatment, the patient has consented to  Procedure(s) with comments: RIGHT BREAST LUMPECTOMY WITH RADIOACTIVE SEED LOCALIZATION (Right) - LMA as a surgical intervention.  The patient's history has been reviewed, patient examined, no change in status, stable for surgery.  I have reviewed the patient's chart and labs.  Questions were answered to the patient's satisfaction.     Coralie Keens

## 2020-05-31 NOTE — Discharge Instructions (Signed)
[place] Office Phone Number [phone]  BREAST BIOPSY/ PARTIAL MASTECTOMY: POST OP INSTRUCTIONS  Always review your discharge instruction sheet given to you by the facility where your surgery was performed.  IF YOU HAVE DISABILITY OR FAMILY LEAVE FORMS, YOU MUST BRING THEM TO THE OFFICE FOR PROCESSING.  DO NOT GIVE THEM TO YOUR DOCTOR.  1. A prescription for pain medication may be given to you upon discharge.  Take your pain medication as prescribed, if needed.  If narcotic pain medicine is not needed, then you may take acetaminophen (Tylenol) or ibuprofen (Advil) as needed. 2. Take your usually prescribed medications unless otherwise directed 3. If you need a refill on your pain medication, please contact your pharmacy.  They will contact our office to request authorization.  Prescriptions will not be filled after 5pm or on week-ends. 4. You should eat very light the first 24 hours after surgery, such as soup, crackers, pudding, etc.  Resume your normal diet the day after surgery. 5. Most patients will experience some swelling and bruising in the breast.  Ice packs and a good support bra will help.  Swelling and bruising can take several days to resolve.  6. It is common to experience some constipation if taking pain medication after surgery.  Increasing fluid intake and taking a stool softener will usually help or prevent this problem from occurring.  A mild laxative (Milk of Magnesia or Miralax) should be taken according to package directions if there are no bowel movements after 48 hours. 7. Unless discharge instructions indicate otherwise, you may remove your bandages 24-48 hours after surgery, and you may shower at that time.  You may have steri-strips (small skin tapes) in place directly over the incision.  These strips should be left on the skin for 7-10 days.  If your surgeon used skin glue on the incision, you may shower in 24 hours.  The glue will flake off over the  next 2-3 weeks.  Any sutures or staples will be removed at the office during your follow-up visit. 8. ACTIVITIES:  You may resume regular daily activities (gradually increasing) beginning the next day.  Wearing a good support bra or sports bra minimizes pain and swelling.  You may have sexual intercourse when it is comfortable. a. You may drive when you no longer are taking prescription pain medication, you can comfortably wear a seatbelt, and you can safely maneuver your car and apply brakes. b. RETURN TO WORK:  ______________________________________________________________________________________ 9. You should see your doctor in the office for a follow-up appointment approximately two weeks after your surgery.  Your doctor's nurse will typically make your follow-up appointment when she calls you with your pathology report.  Expect your pathology report 2-3 business days after your surgery.  You may call to check if you do not hear from [place] after three days. 10. OTHER INSTRUCTIONS:OK TO SHOWER STARTING TOMORROW 11. ICE PACK, TYLENOL, AND IBUPROFEN FOR PAIN 12. NO LIFTING WEIGHTS OR CARDIO FOR ONE WEEK _______________________________________________________________________________________________ _____________________________________________________________________________________________________________________________________ _____________________________________________________________________________________________________________________________________ _____________________________________________________________________________________________________________________________________  WHEN TO CALL YOUR DOCTOR: 1. Fever over 101.0 2. Nausea and/or vomiting. 3. Extreme swelling or bruising. 4. Continued bleeding from incision. 5. Increased pain, redness, or drainage from the incision.  The clinic staff is available to answer your questions during regular business hours.  Please don't hesitate  to call and ask to speak to one of the nurses for clinical concerns.  If you have a medical emergency, go to the nearest emergency room or call 911.  A surgeon from [facility] is always on call at the hospital.  For further questions, please visit [email or web address]   Post Anesthesia Home Care Instructions  Activity: Get plenty of rest for the remainder of the day. A responsible individual must stay with you for 24 hours following the procedure.  For the next 24 hours, DO NOT: -Drive a car -Paediatric nurse -Drink alcoholic beverages -Take any medication unless instructed by your physician -Make any legal decisions or sign important papers.  Meals: Start with liquid foods such as gelatin or soup. Progress to regular foods as tolerated. Avoid greasy, spicy, heavy foods. If nausea and/or vomiting occur, drink only clear liquids until the nausea and/or vomiting subsides. Call your physician if vomiting continues.  Special Instructions/Symptoms: Your throat may feel dry or sore from the anesthesia or the breathing tube placed in your throat during surgery. If this causes discomfort, gargle with warm salt water. The discomfort should disappear within 24 hours.  May take Tylenol after 5pm, if needed.

## 2020-05-31 NOTE — Anesthesia Procedure Notes (Signed)
Procedure Name: LMA Insertion Performed by: [place] Dara Camargo C, CRNA Pre-anesthesia Checklist: Patient identified, Emergency Drugs available, Suction available and Patient being monitored Patient Re-evaluated:Patient Re-evaluated prior to induction Oxygen Delivery Method: Circle system utilized Preoxygenation: Pre-oxygenation with 100% oxygen Induction Type: IV induction Ventilation: Mask ventilation without difficulty LMA: LMA inserted LMA Size: 3.0 Number of attempts: 1 Airway Equipment and Method: Bite block Placement Confirmation: positive ETCO2 Tube secured with: Tape Dental Injury: Teeth and Oropharynx as per pre-operative assessment

## 2020-05-31 NOTE — Transfer of Care (Signed)
Immediate Anesthesia Transfer of Care Note  Patient: Alisha [name]  Procedure(s) Performed: RIGHT BREAST LUMPECTOMY WITH RADIOACTIVE SEEDS X2 LOCALIZATION (Right Breast)  Patient Location: PACU  Anesthesia Type:General  Level of Consciousness: awake, alert  and oriented  Airway & Oxygen Therapy: Patient Spontanous Breathing and Patient connected to face mask oxygen  Post-op Assessment: Report given to RN and Post -op Vital signs reviewed and stable  Post vital signs: Reviewed and stable  Last Vitals:  Vitals Value Taken Time  BP 106/64 05/31/20 1245  Temp    Pulse 66 05/31/20 1247  Resp 14 05/31/20 1247  SpO2 100 % 05/31/20 1247  Vitals shown include unvalidated device data.  Last Pain:  Vitals:   05/31/20 1058  PainSc: 0-No pain         Complications: No complications documented.

## 2020-05-31 NOTE — Op Note (Signed)
RIGHT BREAST LUMPECTOMY WITH RADIOACTIVE SEEDS X2 LOCALIZATION  Procedure Note  Alisha [name] 05/31/2020   Pre-op Diagnosis: RIGHT BREAST MASSES     Post-op Diagnosis: samd  Procedure(s): RIGHT BREAST LUMPECTOMY WITH RADIOACTIVE SEEDS X2 LOCALIZATION  Surgeon(s): Coralie Keens, MD  Anesthesia: General  Staff:  Circulator: Ted Mcalpine, RN Scrub Person: Flavia Shipper, CST  Estimated Blood Loss: Minimal               Specimens: sent to path  Indication: This is a 53 year old female who had a small papilloma and subluxation of the right breast.  He had a follow-up MRI and had 2 other lesions at the 5 and 6 o'clock position of the right breast.  These were biopsied with MRI guidance but only 1 clip deployed at one of the areas.  Both of these areas were complex grossing lesions.  The decision was made to excise all areas with radioactive seed localization  Procedure: The patient was brought to the operating room identifies correct patient.  She is placed upon the operating table general anesthesia was induced.  Her right breast was prepped and draped in usual sterile fashion.  The radioactive seeds were located approximately 6 position and 12 o'clock position the right breast.  I anesthetized the lateral edge of the areola with Marcaine and made a circumareolar incision with a scalpel.  I then dissected down to the breast tissue with electrocautery.  She had a very small breast with minimal breast tissue.  I was dissecting laterally toward the seed and at the area of her previous biopsy the skin had scarred in and I buttonhole the skin trying to free it up from the breast.  I was then able to complete a lumpectomy with the aid of neoprobe removing the area at the 6 o'clock position.  I marked the margins marker pain we x-rayed the specimen.  This confirmed that the radioactive seed and previous biopsy clip were in the specimen.  I went widely in hopes that both previous biopsy  area were in the specimen.  I then identified the radioactive seed at the 12 position with the neoprobe.  I again performed a lumpectomy in this area removing the breast tissue and radioactive seed.  I marked this with marker pain as well and x-rayed the specimen confirming that the previous biopsy clip and radioactive seed were in the specimen.  This was likewise sent to pathology.  I achieved hemostasis with the cautery.  I then closed the subcutaneous tissue with interrupted 3-0 Vicryl sutures and closed the skin with a running 4-0 Monocryl.  Dermabond was applied.  The patient tolerated the procedure well.  All the counts were correct at the end of the procedure.  The patient was then extubated in the operating room and taken in stable condition to the recovery room.          Coralie Keens   Date: 05/31/2020  Time: 12:41 PM

## 2020-05-31 NOTE — Anesthesia Preprocedure Evaluation (Signed)
Anesthesia Evaluation  Patient identified by MRN, date of birth, ID band Patient awake    Reviewed: Allergy & Precautions, NPO status , Patient's Chart, lab work & pertinent test results  Airway Mallampati: II  TM Distance: >3 FB Neck ROM: Full    Dental no notable dental hx.    Pulmonary neg pulmonary ROS,    Pulmonary exam normal breath sounds clear to auscultation       Cardiovascular negative cardio ROS Normal cardiovascular exam Rhythm:Regular Rate:Normal     Neuro/Psych negative neurological ROS  negative psych ROS   GI/Hepatic negative GI ROS, Neg liver ROS,   Endo/Other  negative endocrine ROS  Renal/GU negative Renal ROS     Musculoskeletal negative musculoskeletal ROS (+)   Abdominal   Peds  Hematology negative hematology ROS (+)   Anesthesia Other Findings lesion  Reproductive/Obstetrics                             Anesthesia Physical  Anesthesia Plan  ASA: II  Anesthesia Plan: General   Post-op Pain Management:    Induction: Intravenous  PONV Risk Score and Plan: 3 and Midazolam, Dexamethasone, Ondansetron and Treatment may vary due to age or medical condition  Airway Management Planned: LMA  Additional Equipment:   Intra-op Plan:   Post-operative Plan: Extubation in OR  Informed Consent: I have reviewed the patients History and Physical, chart, labs and discussed the procedure including the risks, benefits and alternatives for the proposed anesthesia with the patient or authorized representative who has indicated his/her understanding and acceptance.     Dental advisory given  Plan Discussed with: CRNA  Anesthesia Plan Comments:         Anesthesia Quick Evaluation

## 2020-06-01 LAB — SURGICAL PATHOLOGY

## 2020-06-04 ENCOUNTER — Encounter (HOSPITAL_BASED_OUTPATIENT_CLINIC_OR_DEPARTMENT_OTHER): Payer: Self-pay | Admitting: Surgery

## 2021-10-07 ENCOUNTER — Other Ambulatory Visit: Payer: Self-pay | Admitting: Family Medicine

## 2021-10-07 DIAGNOSIS — Z9889 Other specified postprocedural states: Secondary | ICD-10-CM

## 2021-10-09 ENCOUNTER — Encounter (HOSPITAL_COMMUNITY): Payer: Self-pay

## 2021-10-28 ENCOUNTER — Ambulatory Visit
Admission: RE | Admit: 2021-10-28 | Discharge: 2021-10-28 | Disposition: A | Discharge status: Home / Self Care | Payer: 59 | Source: Ambulatory Visit | Attending: Family Medicine | Admitting: Family Medicine

## 2021-10-28 ENCOUNTER — Other Ambulatory Visit: Payer: Self-pay | Admitting: Family Medicine

## 2021-10-28 DIAGNOSIS — Z9889 Other specified postprocedural states: Secondary | ICD-10-CM

## 2021-10-28 DIAGNOSIS — Z1231 Encounter for screening mammogram for malignant neoplasm of breast: Secondary | ICD-10-CM

## 2022-01-02 ENCOUNTER — Other Ambulatory Visit: Payer: Self-pay | Admitting: Family Medicine

## 2022-01-14 ENCOUNTER — Other Ambulatory Visit: Payer: Self-pay | Admitting: Family Medicine

## 2022-09-29 ENCOUNTER — Other Ambulatory Visit: Payer: Self-pay | Admitting: Internal Medicine

## 2022-09-29 DIAGNOSIS — Z1231 Encounter for screening mammogram for malignant neoplasm of breast: Secondary | ICD-10-CM

## 2022-11-19 ENCOUNTER — Ambulatory Visit
Admission: RE | Admit: 2022-11-19 | Discharge: 2022-11-19 | Disposition: A | Discharge status: Home / Self Care | Payer: 59 | Source: Ambulatory Visit | Attending: Internal Medicine | Admitting: Internal Medicine

## 2022-11-19 DIAGNOSIS — Z1231 Encounter for screening mammogram for malignant neoplasm of breast: Secondary | ICD-10-CM

## 2023-01-23 ENCOUNTER — Inpatient Hospital Stay: Payer: 59

## 2023-01-23 ENCOUNTER — Inpatient Hospital Stay: Payer: 59 | Attending: Hematology | Admitting: Hematology

## 2023-01-23 ENCOUNTER — Other Ambulatory Visit: Payer: Self-pay

## 2023-01-23 VITALS — BP 116/80 | HR 74 | Temp 97.7°F | Resp 18 | Ht 65.0 in | Wt 115.1 lb

## 2023-01-23 DIAGNOSIS — Z803 Family history of malignant neoplasm of breast: Secondary | ICD-10-CM | POA: Diagnosis not present

## 2023-01-23 DIAGNOSIS — D72810 Lymphocytopenia: Secondary | ICD-10-CM

## 2023-01-23 DIAGNOSIS — Z800 Family history of malignant neoplasm of digestive organs: Secondary | ICD-10-CM

## 2023-01-23 DIAGNOSIS — D649 Anemia, unspecified: Secondary | ICD-10-CM | POA: Diagnosis not present

## 2023-01-23 DIAGNOSIS — M545 Low back pain, unspecified: Secondary | ICD-10-CM | POA: Insufficient documentation

## 2023-01-23 DIAGNOSIS — Z808 Family history of malignant neoplasm of other organs or systems: Secondary | ICD-10-CM | POA: Diagnosis not present

## 2023-01-23 DIAGNOSIS — R1310 Dysphagia, unspecified: Secondary | ICD-10-CM | POA: Diagnosis not present

## 2023-01-23 LAB — SEDIMENTATION RATE: Sed Rate: 9 mm/hr (ref 0–22)

## 2023-01-23 LAB — IRON AND IRON BINDING CAPACITY (CC-WL,HP ONLY)
Iron: 77 ug/dL (ref 28–170)
Saturation Ratios: 18 % (ref 10.4–31.8)
TIBC: 420 ug/dL (ref 250–450)
UIBC: 343 ug/dL (ref 148–442)

## 2023-01-23 LAB — CBC WITH DIFFERENTIAL (CANCER CENTER ONLY)
Abs Immature Granulocytes: 0.01 10*3/uL (ref 0.00–0.07)
Basophils Absolute: 0 10*3/uL (ref 0.0–0.1)
Basophils Relative: 1 %
Eosinophils Absolute: 0.1 10*3/uL (ref 0.0–0.5)
Eosinophils Relative: 1 %
HCT: 37.3 % (ref 36.0–46.0)
Hemoglobin: 12.3 g/dL (ref 12.0–15.0)
Immature Granulocytes: 0 %
Lymphocytes Relative: 19 %
Lymphs Abs: 0.7 10*3/uL (ref 0.7–4.0)
MCH: 30.1 pg (ref 26.0–34.0)
MCHC: 33 g/dL (ref 30.0–36.0)
MCV: 91.2 fL (ref 80.0–100.0)
Monocytes Absolute: 0.3 10*3/uL (ref 0.1–1.0)
Monocytes Relative: 9 %
Neutro Abs: 2.4 10*3/uL (ref 1.7–7.7)
Neutrophils Relative %: 70 %
Platelet Count: 186 10*3/uL (ref 150–400)
RBC: 4.09 MIL/uL (ref 3.87–5.11)
RDW: 12.6 % (ref 11.5–15.5)
WBC Count: 3.5 10*3/uL — ABNORMAL LOW (ref 4.0–10.5)
nRBC: 0 % (ref 0.0–0.2)

## 2023-01-23 LAB — CMP (CANCER CENTER ONLY)
ALT: 18 U/L (ref 0–44)
AST: 23 U/L (ref 15–41)
Albumin: 4.3 g/dL (ref 3.5–5.0)
Alkaline Phosphatase: 62 U/L (ref 38–126)
Anion gap: 5 (ref 5–15)
BUN: 18 mg/dL (ref 6–20)
CO2: 29 mmol/L (ref 22–32)
Calcium: 9.5 mg/dL (ref 8.9–10.3)
Chloride: 107 mmol/L (ref 98–111)
Creatinine: 0.87 mg/dL (ref 0.44–1.00)
GFR, Estimated: >60 mL/min (ref >60)
Glucose, Bld: 80 mg/dL (ref 70–99)
Potassium: 3.9 mmol/L (ref 3.5–5.1)
Sodium: 141 mmol/L (ref 135–145)
Total Bilirubin: 0.4 mg/dL (ref 0.3–1.2)
Total Protein: 6.6 g/dL (ref 6.5–8.1)

## 2023-01-23 LAB — HIV ANTIBODY (ROUTINE TESTING W REFLEX): HIV Screen 4th Generation wRfx: NONREACTIVE

## 2023-01-23 LAB — FERRITIN: Ferritin: 8 ng/mL — ABNORMAL LOW (ref 11–307)

## 2023-01-23 LAB — LACTATE DEHYDROGENASE: LDH: 140 U/L (ref 98–192)

## 2023-01-23 LAB — VITAMIN B12: Vitamin B-12: 968 pg/mL — ABNORMAL HIGH (ref 180–914)

## 2023-01-23 LAB — HEPATITIS C ANTIBODY: HCV Ab: NONREACTIVE

## 2023-01-23 NOTE — Progress Notes (Signed)
HEMATOLOGY/ONCOLOGY CONSULTATION NOTE  Date of Service: 01/23/2023  Patient Care Team: Maurice Small, MD as PCP - General (Family Medicine)  CHIEF COMPLAINTS/PURPOSE OF CONSULTATION:  Evaluation and management of Leukopenia and normocytic anemia  HISTORY OF PRESENTING ILLNESS:   Alisha [name] is a wonderful 57 y.o. female who has been referred to [place] by Maurice Small, MD for evaluation and management of Leukopenia and normocytic anemia.  Today, she is accompanied by her husband. She reports that her WBC did decrease last year. She has not felt different overall over the last 6 months.  She was infected with COVID-19 in December 2023. She did not need any antiviral medications but did use OTC medication. She did endorse lingering coughing symptoms for about one month after her infection resolved. She denies any residual SOB or other symptoms at this time. She denies any other infection or other reason to use antibiotics. No other new medication. Patient did not take any steroid while infected with COVID-19. Patient has not been on steroids in the past. She is otherwise not susceptible to infections. She does regularly take calcium supplements as well as multivitamin.  She reports a family history of colon cancer and melanoma. No autoimmune conditions in the family. She reports that she did have genetic testing a few years ago but is unsure of the exact timeframe. Patient did receive a couple lumpectomies for bening tissues.  She denies engaging in any significant physical endevors. She does exercise 5-6x week and has for about 8 years. No dietary restrictions. Patient has never required blood transfusions and denies having any tattoos.  She does not currently have menstrual cycles and notes that they have been gone for several years. Her menstrual cycles were sometimes heavy.   She denies any unexplained fever, chill, night sweats, new lumps/bumps bowel habits, urin habit, breathing,  or SOB. Patient not smoke or consume alcohol. She has no concern for liver disease.  She reports that she generally consumes a well-balanced diet with plenty of fresh fruits and vegetables. She has lost 2 pounds since last year. She did lose her sense of taste with her COVID-19 infection. She does not tend to endorse seasonal allergies, but does experience this once in a while.   She does endorse lower back pain which she attributes to extended periods of sitting. She denies any abdominal pain or leg swelling. She denies any chemical/radiation exposure.   MEDICAL HISTORY:  Past Medical History:  Diagnosis Date   Family history of cancer    Genetic testing 01/15/2018   Multi-Cancer panel (83 genes) @ [facility] - No pathogenic mutations detected    SURGICAL HISTORY: Past Surgical History:  Procedure Laterality Date   BREAST BIOPSY Right 03/15/2020   BREAST BIOPSY Right 05/02/2020   BREAST EXCISIONAL BIOPSY Left 2019   BREAST EXCISIONAL BIOPSY Right 05/31/2020   BREAST LUMPECTOMY WITH RADIOACTIVE SEED LOCALIZATION Left 07/21/2018   Procedure: LEFT BREAST LUMPECTOMY WITH RADIOACTIVE SEED LOCALIZATION;  Surgeon: Abigail Miyamoto, MD;  Location: [facility];  Service: General;  Laterality: Left;   BREAST LUMPECTOMY WITH RADIOACTIVE SEED LOCALIZATION Right 05/31/2020   Procedure: RIGHT BREAST LUMPECTOMY WITH RADIOACTIVE SEEDS X2 LOCALIZATION;  Surgeon: Abigail Miyamoto, MD;  Location: [facility];  Service: General;  Laterality: Right;  LMA   MASS EXCISION Left 07/21/2018   Procedure: EXCISION OF LEFT BREAST MASS;  Surgeon: Abigail Miyamoto, MD;  Location: [facility];  Service: General;  Laterality: Left;   WISDOM TOOTH  EXTRACTION      SOCIAL HISTORY: Social History   Socioeconomic History   Marital status: Married    Spouse name: Not on file   Number of children: Not on file   Years of education: Not on file   Highest education level: Not on  file  Occupational History   Not on file  Tobacco Use   Smoking status: Never   Smokeless tobacco: Never  Vaping Use   Vaping Use: Never used  Substance and Sexual Activity   Alcohol use: Never   Drug use: Never   Sexual activity: Not on file  Other Topics Concern   Not on file  Social History Narrative   Not on file   Social Determinants of Health   Financial Resource Strain: Not on file  Food Insecurity: Not on file  Transportation Needs: Not on file  Physical Activity: Not on file  Stress: Not on file  Social Connections: Not on file  Intimate Partner Violence: Not on file    FAMILY HISTORY: Family History  Problem Relation Age of Onset   Breast cancer Mother 32   Colon cancer Mother 34       metastatic BC recently; 75 yo currently   Melanoma Father        dx 38s; currently 33   Cancer Paternal Aunt        unk. primary; deceased 20s    ALLERGIES:  is allergic to sulfa antibiotics.  MEDICATIONS:  Current Outpatient Medications  Medication Sig Dispense Refill   calcium carbonate (OSCAL) 1500 (600 Ca) MG TABS tablet Take by mouth 2 (two) times daily with a meal.     Multiple Vitamin (MULTIVITAMIN) capsule Take 1 capsule by mouth daily.     traMADol (ULTRAM) 50 MG tablet Take 1 tablet (50 mg total) by mouth every 6 (six) hours as needed. 20 tablet 0   No current facility-administered medications for this visit.    REVIEW OF SYSTEMS:    10 Point review of Systems was done is negative except as noted above.  PHYSICAL EXAMINATION: ECOG PERFORMANCE STATUS: 1 - Symptomatic but completely ambulatory  . Vitals:   01/23/23 0928  BP: 116/80  Pulse: 74  Resp: 18  Temp: 97.7 F (36.5 C)  SpO2: 100%   Filed Weights   01/23/23 0928  Weight: 115 lb 1.6 oz (52.2 kg)   .Body mass index is 19.15 kg/m.  GENERAL:alert, in no acute distress and comfortable SKIN: no acute rashes, no significant lesions EYES: conjunctiva are pink and non-injected, sclera  anicteric OROPHARYNX: MMM, no exudates, no oropharyngeal erythema or ulceration NECK: supple, no JVD LYMPH:  no palpable lymphadenopathy in the cervical, axillary or inguinal regions LUNGS: clear to auscultation b/l with normal respiratory effort HEART: regular rate & rhythm ABDOMEN:  normoactive bowel sounds , non tender, not distended. No palpable hematolsplenomegalyt Extremity: no pedal edema PSYCH: alert & oriented x 3 with fluent speech NEURO: no focal motor/sensory deficits  LABORATORY DATA:  I have reviewed the data as listed .    Latest Ref Rng & Units 01/23/2023   10:22 AM 01/23/2023   10:21 AM  CBC  WBC 4.0 - 10.5 K/uL  3.5   Hemoglobin 12.0 - 15.0 g/dL  81.1   Hematocrit 91.4 - 46.6 % 37.8  37.3   Platelets 150 - 400 K/uL  186    .    Latest Ref Rng & Units 01/23/2023   10:21 AM  CMP  Glucose 70 - 99  mg/dL 80   BUN 6 - 20 mg/dL 18   Creatinine 1.61 - 1.00 mg/dL 0.96   Sodium 045 - 409 mmol/L 141   Potassium 3.5 - 5.1 mmol/L 3.9   Chloride 98 - 111 mmol/L 107   CO2 22 - 32 mmol/L 29   Calcium 8.9 - 10.3 mg/dL 9.5   Total Protein 6.5 - 8.1 g/dL 6.6   Total Bilirubin 0.3 - 1.2 mg/dL 0.4   Alkaline Phos 38 - 126 U/L 62   AST 15 - 41 U/L 23   ALT 0 - 44 U/L 18     CBC with Diff 01/07/2023:      RADIOGRAPHIC STUDIES: I have personally reviewed the radiological images as listed and agreed with the findings in the report. No results found.  ASSESSMENT & PLAN:   57 y.o. female with:  Leukopenia/lymphopenia Dysphagia  PLAN:  -Discussed lab results from 01/07/2023 in detail with pateint. CBC showed WBC of 3.0K, hemoglobin of 11.8, and platelets of 217K. -platelets normal, RBC show minimal anemia -informed patient that blood transfusions and tattoos are risk factors for certain viral infections -informed patient that neutrophils fight bacterial infection -discussed that normal lymphocyte level is [phone] lymphcyte, patient level is in 700s -informed  patient that low lymphocyte level could be from natural variations, viral infections, steroid use, inaccurate lab, cell distribution -Lymphopenia may be from steroid use, vital infection, or autoimmune conditions -discussed that there is a general tendency to be deficient in B vitamins, especially in patients who are gluten intolerant -informed patient that egg yolk, if completely cooked, is a good source of vitamin B12 -answered all of patient's questions in detail -will order flow cytometry to further evaluate lymphocyte and rule out slow moving lymphoid disorder -will check antibody levels to rule out selective igA deficiency  . Orders Placed This Encounter  Procedures   CBC with Differential (Cancer Center Only)    Standing Status:   Future    Number of Occurrences:   1    Standing Expiration Date:   01/23/2024   CMP (Cancer Center only)    Standing Status:   Future    Number of Occurrences:   1    Standing Expiration Date:   01/23/2024   Lactate dehydrogenase    Standing Status:   Future    Number of Occurrences:   1    Standing Expiration Date:   01/23/2024   Vitamin B12    Standing Status:   Future    Number of Occurrences:   1    Standing Expiration Date:   01/23/2024   Copper, serum    Standing Status:   Future    Number of Occurrences:   1    Standing Expiration Date:   01/23/2024   Hepatitis C antibody    Standing Status:   Future    Number of Occurrences:   1    Standing Expiration Date:   01/23/2024   HIV Antibody (routine testing w rflx)    Standing Status:   Future    Number of Occurrences:   1    Standing Expiration Date:   01/23/2024   Flow Cytometry, Peripheral Blood (Oncology)    Patient with lymphopenia to r/o clonal lymphoproliferative disorder    Standing Status:   Future    Number of Occurrences:   1    Standing Expiration Date:   01/23/2024   Folate RBC    Standing Status:   Future    Number  of Occurrences:   1    Standing Expiration Date:   01/23/2024    Sedimentation rate    Standing Status:   Future    Number of Occurrences:   1    Standing Expiration Date:   01/23/2024   Ferritin    Standing Status:   Future    Number of Occurrences:   1    Standing Expiration Date:   01/23/2024   Multiple Myeloma Panel (SPEP&IFE w/QIG)    Standing Status:   Future    Number of Occurrences:   1    Standing Expiration Date:   01/23/2024   Kappa/lambda light chains    Standing Status:   Future    Number of Occurrences:   1    Standing Expiration Date:   01/23/2024    FOLLOW-UP: Labs today Phone visit with Dr Candise Che in about 1 week  The total time spent in the appointment was 60 minutes* .  All of the patient's questions were answered with apparent satisfaction. The patient knows to call the clinic with any problems, questions or concerns.   Wyvonnia Lora MD MS AAHIVMS [facility] Hematology/Oncology Physician [facility]  .*Total Encounter Time as defined by the Centers for Medicare and Medicaid Services includes, in addition to the face-to-face time of a patient visit (documented in the note above) non-face-to-face time: obtaining and reviewing outside history, ordering and reviewing medications, tests or procedures, care coordination (communications with other health care professionals or caregivers) and documentation in the medical record.    I,Mitra Faeizi,acting as a Neurosurgeon for Wyvonnia Lora, MD.,have documented all relevant documentation on the behalf of Wyvonnia Lora, MD,as directed by  Wyvonnia Lora, MD while in the presence of Wyvonnia Lora, MD.  .I have reviewed the above documentation for accuracy and completeness, and I agree with the above. Johney Maine MD

## 2023-01-25 LAB — FOLATE RBC
Folate, Hemolysate: 475 ng/mL
Folate, RBC: 1257 ng/mL (ref >498)
Hematocrit: 37.8 % (ref 34.0–46.6)

## 2023-01-27 ENCOUNTER — Telehealth: Payer: Self-pay | Admitting: Hematology

## 2023-01-27 LAB — KAPPA/LAMBDA LIGHT CHAINS
Kappa free light chain: 9.9 mg/L (ref 3.3–19.4)
Kappa, lambda light chain ratio: 0.91 (ref 0.26–1.65)
Lambda free light chains: 10.9 mg/L (ref 5.7–26.3)

## 2023-01-28 LAB — SURGICAL PATHOLOGY

## 2023-01-29 LAB — FLOW CYTOMETRY

## 2023-01-29 LAB — COPPER, SERUM: Copper: 109 ug/dL (ref 80–158)

## 2023-01-30 ENCOUNTER — Inpatient Hospital Stay (HOSPITAL_BASED_OUTPATIENT_CLINIC_OR_DEPARTMENT_OTHER): Payer: 59 | Admitting: Hematology

## 2023-01-30 DIAGNOSIS — D649 Anemia, unspecified: Secondary | ICD-10-CM

## 2023-01-30 DIAGNOSIS — D72810 Lymphocytopenia: Secondary | ICD-10-CM | POA: Diagnosis not present

## 2023-01-30 MED ORDER — POLYSACCHARIDE IRON COMPLEX 150 MG PO CAPS: 150.0000 mg | ORAL_CAPSULE | Freq: Every day | ORAL | 2 refills | Status: AC

## 2023-01-30 NOTE — Progress Notes (Signed)
HEMATOLOGY/ONCOLOGY PHONE VISIT NOTE  Date of Service: 01/30/2023  Patient Care Team: Maurice Small, MD (Inactive) as PCP - General (Family Medicine)  CHIEF COMPLAINTS/PURPOSE OF CONSULTATION:  Evaluation and management of Leukopenia and normocytic anemia  HISTORY OF PRESENTING ILLNESS:   Alisha [name] is a wonderful 57 y.o. female who has been referred to [place] by Maurice Small, MD for evaluation and management of Leukopenia and normocytic anemia.  Today, she is accompanied by her husband. She reports that her WBC did decrease last year. She has not felt different overall over the last 6 months.  She was infected with COVID-19 in December 2023. She did not need any antiviral medications but did use OTC medication. She did endorse lingering coughing symptoms for about one month after her infection resolved. She denies any residual SOB or other symptoms at this time. She denies any other infection or other reason to use antibiotics. No other new medication. Patient did not take any steroid while infected with COVID-19. Patient has not been on steroids in the past. She is otherwise not susceptible to infections. She does regularly take calcium supplements as well as multivitamin.  She reports a family history of colon cancer and melanoma. No autoimmune conditions in the family. She reports that she did have genetic testing a few years ago but is unsure of the exact timeframe. Patient did receive a couple lumpectomies for bening tissues.  She denies engaging in any significant physical endevors. She does exercise 5-6x week and has for about 8 years. No dietary restrictions. Patient has never required blood transfusions and denies having any tattoos.  She does not currently have menstrual cycles and notes that they have been gone for several years. Her menstrual cycles were sometimes heavy.   She denies any unexplained fever, chill, night sweats, new lumps/bumps bowel habits, urin habit,  breathing, or SOB. Patient not smoke or consume alcohol. She has no concern for liver disease.  She reports that she generally consumes a well-balanced diet with plenty of fresh fruits and vegetables. She has lost 2 pounds since last year. She did lose her sense of taste with her COVID-19 infection. She does not tend to endorse seasonal allergies, but does experience this once in a while.   She does endorse lower back pain which she attributes to extended periods of sitting. She denies any abdominal pain or leg swelling. She denies any chemical/radiation exposure.   INTERVAL HISTORY:  Alisha [name] is a 57 y.o. female here for continued evaluation and management of Leukopenia and normocytic anemia. Patient was initially seen by me on 01/23/2023 and reported lower back pain and a loss of taste after a COVID-19 infection.  I connected with Daytona Stanforth on05/31/24 at  2:30 PM EDT by telephone visit and verified that I am speaking with the correct person using two identifiers.   I discussed the limitations, risks, security and privacy concerns of performing an evaluation and management service by telemedicine and the availability of in-person appointments. I also discussed with the patient that there may be a patient responsible charge related to this service. The patient expressed understanding and agreed to proceed.   Other persons participating in the visit and their role in the encounter: none   Patient's location: home  Provider's location: [facility]   Chief Complaint: Evaluation and management of Leukopenia and normocytic anemia    Today, she reports that she has no dietary restrictions. She no longer has menstrual cycles. She is not  on any acid suppressants. She is UTD with colonoscopies. The results of her recent lab workup was discussed with her in detail.   MEDICAL HISTORY:  Past Medical History:  Diagnosis Date   Family history of cancer    Genetic testing 01/15/2018   Multi-Cancer  panel (83 genes) @ [facility] - No pathogenic mutations detected    SURGICAL HISTORY: Past Surgical History:  Procedure Laterality Date   BREAST BIOPSY Right 03/15/2020   BREAST BIOPSY Right 05/02/2020   BREAST EXCISIONAL BIOPSY Left 2019   BREAST EXCISIONAL BIOPSY Right 05/31/2020   BREAST LUMPECTOMY WITH RADIOACTIVE SEED LOCALIZATION Left 07/21/2018   Procedure: LEFT BREAST LUMPECTOMY WITH RADIOACTIVE SEED LOCALIZATION;  Surgeon: Abigail Miyamoto, MD;  Location: [facility];  Service: General;  Laterality: Left;   BREAST LUMPECTOMY WITH RADIOACTIVE SEED LOCALIZATION Right 05/31/2020   Procedure: RIGHT BREAST LUMPECTOMY WITH RADIOACTIVE SEEDS X2 LOCALIZATION;  Surgeon: Abigail Miyamoto, MD;  Location: [facility];  Service: General;  Laterality: Right;  LMA   MASS EXCISION Left 07/21/2018   Procedure: EXCISION OF LEFT BREAST MASS;  Surgeon: Abigail Miyamoto, MD;  Location: [facility];  Service: General;  Laterality: Left;   WISDOM TOOTH EXTRACTION      SOCIAL HISTORY: Social History   Socioeconomic History   Marital status: Married    Spouse name: Not on file   Number of children: Not on file   Years of education: Not on file   Highest education level: Not on file  Occupational History   Not on file  Tobacco Use   Smoking status: Never   Smokeless tobacco: Never  Vaping Use   Vaping Use: Never used  Substance and Sexual Activity   Alcohol use: Never   Drug use: Never   Sexual activity: Not on file  Other Topics Concern   Not on file  Social History Narrative   Not on file   Social Determinants of Health   Financial Resource Strain: Not on file  Food Insecurity: Not on file  Transportation Needs: Not on file  Physical Activity: Not on file  Stress: Not on file  Social Connections: Not on file  Intimate Partner Violence: Not on file    FAMILY HISTORY: Family History  Problem Relation Age of Onset   Breast cancer  Mother 68   Colon cancer Mother 86       metastatic BC recently; 64 yo currently   Melanoma Father        dx 84s; currently 35   Cancer Paternal Aunt        unk. primary; deceased 82s    ALLERGIES:  is allergic to sulfa antibiotics.  MEDICATIONS:  Current Outpatient Medications  Medication Sig Dispense Refill   calcium carbonate (OSCAL) 1500 (600 Ca) MG TABS tablet Take by mouth 2 (two) times daily with a meal.     Multiple Vitamin (MULTIVITAMIN) capsule Take 1 capsule by mouth daily.     traMADol (ULTRAM) 50 MG tablet Take 1 tablet (50 mg total) by mouth every 6 (six) hours as needed. (Patient not taking: Reported on 01/23/2023) 20 tablet 0   No current facility-administered medications for this visit.    REVIEW OF SYSTEMS:    10 Point review of Systems was done is negative except as noted above.   PHYSICAL EXAMINATION: TELEPHONE VISIT ECOG PERFORMANCE STATUS: 1 - Symptomatic but completely ambulatory  . There were no vitals filed for this visit.  There were no vitals filed for  this visit.  .There is no height or weight on file to calculate BMI.   LABORATORY DATA:  I have reviewed the data as listed .    Latest Ref Rng & Units 01/23/2023   10:22 AM 01/23/2023   10:21 AM  CBC  WBC 4.0 - 10.5 K/uL  3.5   Hemoglobin 12.0 - 15.0 g/dL  16.1   Hematocrit 09.6 - 46.6 % 37.8  37.3   Platelets 150 - 400 K/uL  186    .    Latest Ref Rng & Units 01/23/2023   10:21 AM  CMP  Glucose 70 - 99 mg/dL 80   BUN 6 - 20 mg/dL 18   Creatinine 0.45 - 1.00 mg/dL 4.09   Sodium 811 - 914 mmol/L 141   Potassium 3.5 - 5.1 mmol/L 3.9   Chloride 98 - 111 mmol/L 107   CO2 22 - 32 mmol/L 29   Calcium 8.9 - 10.3 mg/dL 9.5   Total Protein 6.5 - 8.1 g/dL 6.6   Total Bilirubin 0.3 - 1.2 mg/dL 0.4   Alkaline Phos 38 - 126 U/L 62   AST 15 - 41 U/L 23   ALT 0 - 44 U/L 18     01/23/2023 Flow Cytometry:   CBC with Diff 01/07/2023:      RADIOGRAPHIC STUDIES: I have personally reviewed  the radiological images as listed and agreed with the findings in the report. No results found.  ASSESSMENT & PLAN:   57 y.o. female with:  Leukopenia/lymphopenia Dysphagia  PLAN:  -Discussed lab results from 01/23/2023 in detail with patient. CBC showed WBC of 3.5K, hemoglobin of 12.3, and platelets of 186K. -WBC improved 3K to 3.5K, platelets and hemoglobin normal -Breakup of WBCs normal, no suggestion of bone marrow process -leukopenia may be a temporary process such as passing viral infection -Myeloma panel pending -K/L light chains normal -Hepatitis testing negative -Folic acid normal -LDH normal -Flow cytometry did not show any abnormal lymphocyte population -vitamin B12 normal -Copper levels normal -patient is iron deficient with ferratin level of 8 and iron saturation 18% -discussed ferratin goal of 50 and iron saturation goal of 30% -patient is not anemic from iron deficiency -subtle iron deficiency may be due to slow blood loss or lack of consumption of foods with adequate iron sources -continue to follow regularly with PCP to monitor iron deficiency with labs and to consider stool testing to rule out any concern for blood in stools and GI workup or endoscopy -recommend OTC iron polysaccharide supplement to optimize iron stores -recommend patient to consume iron rich foods such as meat and dates to optimize iron stores -may consider bone marrow examination for further evaluation though this is not needed at this time -recommend patient to stay UTD with colonoscopies   FOLLOW-UP: ***  The total time spent in the appointment was *** minutes* .  All of the patient's questions were answered with apparent satisfaction. The patient knows to call the clinic with any problems, questions or concerns.   Wyvonnia Lora MD MS AAHIVMS [facility] Hematology/Oncology Physician [facility]  .*Total Encounter Time as defined by the Centers for Medicare and Medicaid  Services includes, in addition to the face-to-face time of a patient visit (documented in the note above) non-face-to-face time: obtaining and reviewing outside history, ordering and reviewing medications, tests or procedures, care coordination (communications with other health care professionals or caregivers) and documentation in the medical record.    I,Mitra Faeizi,acting as a Neurosurgeon  for Wyvonnia Lora, MD.,have documented all relevant documentation on the behalf of Wyvonnia Lora, MD,as directed by  Wyvonnia Lora, MD while in the presence of Wyvonnia Lora, MD.  ***

## 2023-01-30 NOTE — Progress Notes (Incomplete)
HEMATOLOGY/ONCOLOGY CONSULTATION NOTE  Date of Service: 01/23/2023  Patient Care Team: Maurice Small, MD as PCP - General (Family Medicine)  CHIEF COMPLAINTS/PURPOSE OF CONSULTATION:  Evaluation and management of Leukopenia and normocytic anemia  HISTORY OF PRESENTING ILLNESS:   Alisha [name] is a wonderful 57 y.o. female who has been referred to [place] by Maurice Small, MD for evaluation and management of Leukopenia and normocytic anemia.  Today, she is accompanied by her husband. She reports that her WBC did decrease last year. She has not felt different overall over the last 6 months.  She was infected with COVID-19 in December 2023. She did not need any antiviral medications but did use OTC medication. She did endorse lingering coughing symptoms for about one month after her infection resolved. She denies any residual SOB or other symptoms at this time. She denies any other infection or other reason to use antibiotics. No other new medication. Patient did not take any steroid while infected with COVID-19. Patient has not been on steroids in the past. She is otherwise not susceptible to infections. She does regularly take calcium supplements as well as multivitamin.  She reports a family history of colon cancer and melanoma. No autoimmune conditions in the family. She reports that she did have genetic testing a few years ago but is unsure of the exact timeframe. Patient did receive a couple lumpectomies for bening tissues.  She denies engaging in any significant physical endevors. She does exercise 5-6x week and has for about 8 years. No dietary restrictions. Patient has never required blood transfusions and denies having any tattoos.  She does not currently have menstrual cycles and notes that they have been gone for several years. Her menstrual cycles were sometimes heavy.   She denies any unexplained fever, chill, night sweats, new lumps/bumps bowel habits, urin habit, breathing,  or SOB. Patient not smoke or consume alcohol. She has no concern for liver disease.  She reports that she generally consumes a well-balanced diet with plenty of fresh fruits and vegetables. She has lost 2 pounds since last year. She did lose her sense of taste with her COVID-19 infection. She does not tend to endorse seasonal allergies, but does experience this once in a while.   She does endorse lower back pain which she attributes to extended periods of sitting. She denies any abdominal pain or leg swelling. She denies any chemical/radiation exposure.   MEDICAL HISTORY:  Past Medical History:  Diagnosis Date  . Family history of cancer   . Genetic testing 01/15/2018   Multi-Cancer panel (83 genes) @ [facility] - No pathogenic mutations detected    SURGICAL HISTORY: Past Surgical History:  Procedure Laterality Date  . BREAST BIOPSY Right 03/15/2020  . BREAST BIOPSY Right 05/02/2020  . BREAST EXCISIONAL BIOPSY Left 2019  . BREAST EXCISIONAL BIOPSY Right 05/31/2020  . BREAST LUMPECTOMY WITH RADIOACTIVE SEED LOCALIZATION Left 07/21/2018   Procedure: LEFT BREAST LUMPECTOMY WITH RADIOACTIVE SEED LOCALIZATION;  Surgeon: Abigail Miyamoto, MD;  Location: [facility];  Service: General;  Laterality: Left;  . BREAST LUMPECTOMY WITH RADIOACTIVE SEED LOCALIZATION Right 05/31/2020   Procedure: RIGHT BREAST LUMPECTOMY WITH RADIOACTIVE SEEDS X2 LOCALIZATION;  Surgeon: Abigail Miyamoto, MD;  Location: [facility];  Service: General;  Laterality: Right;  LMA  . MASS EXCISION Left 07/21/2018   Procedure: EXCISION OF LEFT BREAST MASS;  Surgeon: Abigail Miyamoto, MD;  Location: [facility];  Service: General;  Laterality: Left;  . WISDOM TOOTH  EXTRACTION      SOCIAL HISTORY: Social History   Socioeconomic History  . Marital status: Married    Spouse name: Not on file  . Number of children: Not on file  . Years of education: Not on file  . Highest education  level: Not on file  Occupational History  . Not on file  Tobacco Use  . Smoking status: Never  . Smokeless tobacco: Never  Vaping Use  . Vaping Use: Never used  Substance and Sexual Activity  . Alcohol use: Never  . Drug use: Never  . Sexual activity: Not on file  Other Topics Concern  . Not on file  Social History Narrative  . Not on file   Social Determinants of Health   Financial Resource Strain: Not on file  Food Insecurity: Not on file  Transportation Needs: Not on file  Physical Activity: Not on file  Stress: Not on file  Social Connections: Not on file  Intimate Partner Violence: Not on file    FAMILY HISTORY: Family History  Problem Relation Age of Onset  . Breast cancer Mother 44  . Colon cancer Mother 59       metastatic BC recently; 83 yo currently  . Melanoma Father        dx 75s; currently 26  . Cancer Paternal Aunt        unk. primary; deceased 71s    ALLERGIES:  is allergic to sulfa antibiotics.  MEDICATIONS:  Current Outpatient Medications  Medication Sig Dispense Refill  . calcium carbonate (OSCAL) 1500 (600 Ca) MG TABS tablet Take by mouth 2 (two) times daily with a meal.    . Multiple Vitamin (MULTIVITAMIN) capsule Take 1 capsule by mouth daily.    . traMADol (ULTRAM) 50 MG tablet Take 1 tablet (50 mg total) by mouth every 6 (six) hours as needed. 20 tablet 0   No current facility-administered medications for this visit.    REVIEW OF SYSTEMS:    10 Point review of Systems was done is negative except as noted above.  PHYSICAL EXAMINATION: ECOG PERFORMANCE STATUS: 1 - Symptomatic but completely ambulatory  . Vitals:   01/23/23 0928  BP: 116/80  Pulse: 74  Resp: 18  Temp: 97.7 F (36.5 C)  SpO2: 100%   Filed Weights   01/23/23 0928  Weight: 115 lb 1.6 oz (52.2 kg)   .Body mass index is 19.15 kg/m.  GENERAL:alert, in no acute distress and comfortable SKIN: no acute rashes, no significant lesions EYES: conjunctiva are pink  and non-injected, sclera anicteric OROPHARYNX: MMM, no exudates, no oropharyngeal erythema or ulceration NECK: supple, no JVD LYMPH:  no palpable lymphadenopathy in the cervical, axillary or inguinal regions LUNGS: clear to auscultation b/l with normal respiratory effort HEART: regular rate & rhythm ABDOMEN:  normoactive bowel sounds , non tender, not distended. No palpable hematolsplenomegalyt Extremity: no pedal edema PSYCH: alert & oriented x 3 with fluent speech NEURO: no focal motor/sensory deficits  LABORATORY DATA:  I have reviewed the data as listed .    Latest Ref Rng & Units 01/23/2023   10:22 AM 01/23/2023   10:21 AM  CBC  WBC 4.0 - 10.5 K/uL  3.5   Hemoglobin 12.0 - 15.0 g/dL  19.1   Hematocrit 47.8 - 46.6 % 37.8  37.3   Platelets 150 - 400 K/uL  186    .    Latest Ref Rng & Units 01/23/2023   10:21 AM  CMP  Glucose 70 - 99  mg/dL 80   BUN 6 - 20 mg/dL 18   Creatinine 4.09 - 1.00 mg/dL 8.11   Sodium 914 - 782 mmol/L 141   Potassium 3.5 - 5.1 mmol/L 3.9   Chloride 98 - 111 mmol/L 107   CO2 22 - 32 mmol/L 29   Calcium 8.9 - 10.3 mg/dL 9.5   Total Protein 6.5 - 8.1 g/dL 6.6   Total Bilirubin 0.3 - 1.2 mg/dL 0.4   Alkaline Phos 38 - 126 U/L 62   AST 15 - 41 U/L 23   ALT 0 - 44 U/L 18     CBC with Diff 01/07/2023:      RADIOGRAPHIC STUDIES: I have personally reviewed the radiological images as listed and agreed with the findings in the report. No results found.  ASSESSMENT & PLAN:   57 y.o. female with:  Leukopenia  PLAN:  -Discussed lab results from 01/07/2023 in detail with pateint. CBC showed WBC of 3.0K, hemoglobin of 11.8, and platelets of 217K. -platelets normal, RBC show minimal anemia -informed patient that blood transfusions and tattoos are risk factors for certain viral infections -informed patient that neutrophils fight bacterial infection -discussed that normal lymphocyte level is [phone] lymphcyte, patient level is in 700s -informed  patient that low lymphocyte level could be from natural variations, viral infections, steroid use, inaccurate lab, cell distribution -Lymphopenia may be from steroid use, vital infection, or autoimmune conditions -discussed that there is a general tendency to be deficient in B vitamins, especially in patients who are gluten intolerant -informed patient that egg yolk, if completely cooked, is a good source of vitamin B12 -answered all of patient's questions in detail -will order flow cytometry to further evaluate lymphocyte and rule out slow moving lymphoid disorder -will check antibody levels to rule out selective igA deficiency  FOLLOW-UP: Labs today Phone visit with Dr Candise Che in about 1 week  The total time spent in the appointment was *** minutes* .  All of the patient's questions were answered with apparent satisfaction. The patient knows to call the clinic with any problems, questions or concerns.   Wyvonnia Lora MD MS AAHIVMS [facility] Hematology/Oncology Physician [facility]  .*Total Encounter Time as defined by the Centers for Medicare and Medicaid Services includes, in addition to the face-to-face time of a patient visit (documented in the note above) non-face-to-face time: obtaining and reviewing outside history, ordering and reviewing medications, tests or procedures, care coordination (communications with other health care professionals or caregivers) and documentation in the medical record.    I,Mitra Faeizi,acting as a Neurosurgeon for Wyvonnia Lora, MD.,have documented all relevant documentation on the behalf of Wyvonnia Lora, MD,as directed by  Wyvonnia Lora, MD while in the presence of Wyvonnia Lora, MD.  ***

## 2023-02-03 LAB — MULTIPLE MYELOMA PANEL, SERUM
Albumin SerPl Elph-Mcnc: 3.7 g/dL (ref 2.9–4.4)
Albumin/Glob SerPl: 1.5 (ref 0.7–1.7)
Alpha 1: 0.2 g/dL (ref 0.0–0.4)
Alpha2 Glob SerPl Elph-Mcnc: 0.7 g/dL (ref 0.4–1.0)
B-Globulin SerPl Elph-Mcnc: 1 g/dL (ref 0.7–1.3)
Gamma Glob SerPl Elph-Mcnc: 0.6 g/dL (ref 0.4–1.8)
Globulin, Total: 2.6 g/dL (ref 2.2–3.9)
IgA: 94 mg/dL (ref 87–352)
IgG (Immunoglobin G), Serum: 551 mg/dL — ABNORMAL LOW (ref 586–1602)
IgM (Immunoglobulin M), Srm: 232 mg/dL — ABNORMAL HIGH (ref 26–217)
Total Protein ELP: 6.3 g/dL (ref 6.0–8.5)

## 2023-09-11 ENCOUNTER — Other Ambulatory Visit: Payer: Self-pay | Admitting: Internal Medicine

## 2023-09-11 DIAGNOSIS — H9312 Tinnitus, left ear: Secondary | ICD-10-CM

## 2023-10-01 ENCOUNTER — Ambulatory Visit
Admission: RE | Admit: 2023-10-01 | Discharge: 2023-10-01 | Disposition: A | Discharge status: Home / Self Care | Payer: 59 | Source: Ambulatory Visit | Attending: Internal Medicine | Admitting: Internal Medicine

## 2023-10-01 DIAGNOSIS — H9312 Tinnitus, left ear: Secondary | ICD-10-CM

## 2023-10-01 MED ORDER — IOPAMIDOL (ISOVUE-370) INJECTION 76%
75.0000 mL | Freq: Once | INTRAVENOUS | Status: AC | PRN
  Administered 2023-10-01: 75 mL via INTRAVENOUS

## 2023-10-30 ENCOUNTER — Other Ambulatory Visit: Payer: Self-pay | Admitting: Internal Medicine

## 2023-10-30 DIAGNOSIS — Z1231 Encounter for screening mammogram for malignant neoplasm of breast: Secondary | ICD-10-CM

## 2023-11-20 ENCOUNTER — Ambulatory Visit
Admission: RE | Admit: 2023-11-20 | Discharge: 2023-11-20 | Disposition: A | Discharge status: Home / Self Care | Payer: 59 | Source: Ambulatory Visit | Attending: Internal Medicine | Admitting: Internal Medicine

## 2023-11-20 DIAGNOSIS — Z1231 Encounter for screening mammogram for malignant neoplasm of breast: Secondary | ICD-10-CM

## 2023-12-28 ENCOUNTER — Ambulatory Visit (INDEPENDENT_AMBULATORY_CARE_PROVIDER_SITE_OTHER): Payer: 59 | Admitting: Otolaryngology

## 2023-12-28 ENCOUNTER — Ambulatory Visit (INDEPENDENT_AMBULATORY_CARE_PROVIDER_SITE_OTHER): Payer: 59 | Admitting: Audiology

## 2023-12-28 ENCOUNTER — Encounter (INDEPENDENT_AMBULATORY_CARE_PROVIDER_SITE_OTHER): Payer: Self-pay

## 2023-12-28 VITALS — BP 123/75 | HR 68 | Ht 65.0 in | Wt 114.0 lb

## 2023-12-28 DIAGNOSIS — Z0110 Encounter for examination of ears and hearing without abnormal findings: Secondary | ICD-10-CM | POA: Diagnosis not present

## 2023-12-28 DIAGNOSIS — H93A2 Pulsatile tinnitus, left ear: Secondary | ICD-10-CM

## 2023-12-28 DIAGNOSIS — H93292 Other abnormal auditory perceptions, left ear: Secondary | ICD-10-CM

## 2023-12-28 NOTE — Progress Notes (Signed)
  [address] [place], [place] [address] [phone]  Audiological Evaluation    Name: Alisha [name]     DOB:   11/09/1965      MRN:   [id number]                                                                                     Service Date: 12/28/2023     Accompanied by: unaccompanied    Patient comes today after Dr. Darlin Ehrlich, ENT sent a referral for a hearing evaluation due to concerns with tinnitus.   Symptoms Yes Details  Hearing loss  []    Tinnitus  [x]  Left - Swooshing/pulsatile constant sound  (sometimes thumping/vibrating) onset December 2024  Ear pain/ infections/pressure  []    Balance problems  []    Noise exposure history  []    Previous ear surgeries  []    Family history of hearing loss  [x]  Father with age  Amplification  []    Other  [x]  10-01-2023: "Head neck CT scan showed high riding left jugular bulb"    Otoscopy: Right ear: Clear external ear canals and notable landmarks visualized on the tympanic membrane. Left ear:  Clear external ear canals and notable landmarks visualized on the tympanic membrane.  Tympanometry: Right ear: Type A- Normal external ear canal volume with normal middle ear pressure and tympanic membrane compliance Left ear: Type A- Normal external ear canal volume with normal middle ear pressure and tympanic membrane compliance    Pure tone Audiometry: Both ears- Normal hearing form 125-8000Hz .   Speech Audiometry: Right ear- Speech Reception Threshold (SRT) was obtained at 10 dBHL. Left ear-Speech Reception Threshold (SRT) was obtained at 10 dBHL.   Word Recognition Score Tested using NU-6 (MLV) Right ear: 100% was obtained at a presentation level of 55 dBHL with contralateral masking which is deemed as  excellent. Left ear: 100% was obtained at a presentation level of 55 dBHL with contralateral masking which is deemed as  excellent.   The hearing test results were completed under headphones and results are deemed to be of  good reliability. Test technique:  conventional      Recommendations: Follow up with ENT as scheduled for today. Return for a hearing evaluation if concerns with hearing changes arise or per MD recommendation.   Dhaval Woo MARIE LEROUX-MARTINEZ, AUD

## 2023-12-29 DIAGNOSIS — H93A2 Pulsatile tinnitus, left ear: Secondary | ICD-10-CM | POA: Insufficient documentation

## 2023-12-29 NOTE — Progress Notes (Signed)
 Patient ID: Alisha [name], female   DOB: Apr 16, 1966, 58 y.o.   MRN: [id number]  CC: Left ear pulsatile tinnitus  HPI:  Alisha [name] is a 58 y.o. female who presents today complaining of left ear pulsatile tinnitus for the past 4 months.  According to the patient, her pulsatile tinnitus is constant.  She denies any otalgia, otorrhea, or vertigo.  She also denies any significant hearing difficulty.  She has no previous otitis media or otitis externa.  She also denies any otologic surgery.  She recently underwent a CT angiography of her head and neck.  She was noted to have a left high riding jugular bulb.  She was also noted to have dehiscence of bilateral carotid canal.  No significant vascular abnormality was noted.  Past Medical History:  Diagnosis Date   Family history of cancer    Genetic testing 01/15/2018   Multi-Cancer panel (83 genes) @ [facility] - No pathogenic mutations detected    Past Surgical History:  Procedure Laterality Date   BREAST BIOPSY Right 03/15/2020   BREAST BIOPSY Right 05/02/2020   BREAST EXCISIONAL BIOPSY Left 2019   BREAST EXCISIONAL BIOPSY Right 05/31/2020   BREAST LUMPECTOMY WITH RADIOACTIVE SEED LOCALIZATION Left 07/21/2018   Procedure: LEFT BREAST LUMPECTOMY WITH RADIOACTIVE SEED LOCALIZATION;  Surgeon: Oza Blumenthal, MD;  Location: [facility];  Service: General;  Laterality: Left;   BREAST LUMPECTOMY WITH RADIOACTIVE SEED LOCALIZATION Right 05/31/2020   Procedure: RIGHT BREAST LUMPECTOMY WITH RADIOACTIVE SEEDS X2 LOCALIZATION;  Surgeon: Oza Blumenthal, MD;  Location: [facility];  Service: General;  Laterality: Right;  LMA   MASS EXCISION Left 07/21/2018   Procedure: EXCISION OF LEFT BREAST MASS;  Surgeon: Oza Blumenthal, MD;  Location: [facility];  Service: General;  Laterality: Left;   WISDOM TOOTH EXTRACTION      Family History  Problem Relation Age of Onset   Breast cancer Mother 31   Colon  cancer Mother 14       metastatic BC recently; 7 yo currently   Melanoma Father        dx 77s; currently 32   Cancer Paternal Aunt        unk. primary; deceased 62s    Social History:  reports that she has never smoked. She has never used smokeless tobacco. She reports that she does not drink alcohol and does not use drugs.  Allergies:  Allergies  Allergen Reactions   Sulfa Antibiotics Rash    Prior to Admission medications   Medication Sig Start Date End Date Taking? Authorizing Provider  calcium carbonate (OSCAL) 1500 (600 Ca) MG TABS tablet Take by mouth 2 (two) times daily with a meal.   Yes [provider]  ferrous sulfate 325 (65 FE) MG tablet Take 325 mg by mouth daily. In the evening.   Yes [provider]  Multiple Vitamin (MULTIVITAMIN) capsule Take 1 capsule by mouth daily.   Yes [provider]  iron  polysaccharides (NIFEREX) 150 MG capsule Take 1 capsule (150 mg total) by mouth daily. Patient not taking: Reported on 12/28/2023 01/30/23   Frankie Israel, MD  traMADol  (ULTRAM ) 50 MG tablet Take 1 tablet (50 mg total) by mouth every 6 (six) hours as needed. Patient not taking: Reported on 01/23/2023 05/31/20   Oza Blumenthal, MD    Blood pressure 123/75, pulse 68, height 5\' 5"  (1.651 m), weight 114 lb (51.7 kg), last menstrual period 03/01/2018, SpO2 95%. Exam: General: Communicates without difficulty,  well nourished, no acute distress. Head: Normocephalic, no evidence injury, no tenderness, facial buttresses intact without stepoff. Face/sinus: No tenderness to palpation and percussion. Facial movement is normal and symmetric. Eyes: PERRL, EOMI. No scleral icterus, conjunctivae clear. Neuro: CN II exam reveals vision grossly intact.  No nystagmus at any point of gaze. Ears: Auricles well formed without lesions.  Ear canals are intact without mass or lesion.  No erythema or edema is appreciated.  The TMs are intact without fluid. Nose: External  evaluation reveals normal support and skin without lesions.  Dorsum is intact.  Anterior rhinoscopy reveals normal mucosa over anterior aspect of inferior turbinates and intact septum.  No purulence noted. Oral:  Oral cavity and oropharynx are intact, symmetric, without erythema or edema.  Mucosa is moist without lesions. Neck: Full range of motion without pain.  There is no significant lymphadenopathy.  No masses palpable.  Thyroid bed within normal limits to palpation.  Parotid glands and submandibular glands equal bilaterally without mass.  Trachea is midline. Neuro:  CN 2-12 grossly intact.   Her hearing test shows normal hearing bilaterally across all frequencies.  Assessment: 1.  Left ear pulsatile tinnitus of unknown etiology. The possible differential diagnoses include vascular tumor, AV malformation, hyperdynamic state, aneurysm, sinus thrombosis, or other idiopathic causes.   2.  Her ear canals, tympanic membranes, and middle ear spaces are all normal. 3.  Normal hearing bilaterally across all frequencies.  Plan: 1.  The physical exam findings and the CT angiography results are reviewed with the patient. 2.  MRI scan with gadolinium contrast to evaluate for possible causes of her pulsatile tinnitus. 3.  The strategies to cope with tinnitus, including the use of masker, hearing aids, tinnitus retraining therapy, and avoidance of caffeine and alcohol are discussed. 4.  The patient will return for reevaluation after her MRI scan.  Amila Callies W Zayan Delvecchio 12/29/2023, 9:58 AM

## 2024-01-01 ENCOUNTER — Encounter: Payer: Self-pay | Admitting: Audiology

## 2024-01-05 ENCOUNTER — Ambulatory Visit: Admission: RE | Admit: 2024-01-05 | Source: Ambulatory Visit

## 2024-01-05 ENCOUNTER — Ambulatory Visit (HOSPITAL_COMMUNITY)
Admission: RE | Admit: 2024-01-05 | Discharge: 2024-01-05 | Disposition: A | Discharge status: Home / Self Care | Source: Ambulatory Visit | Attending: Otolaryngology | Admitting: Otolaryngology

## 2024-01-05 DIAGNOSIS — H93A2 Pulsatile tinnitus, left ear: Secondary | ICD-10-CM | POA: Insufficient documentation

## 2024-01-05 MED ORDER — GADOBUTROL 1 MMOL/ML IV SOLN
5.0000 mL | Freq: Once | INTRAVENOUS | Status: AC | PRN
  Administered 2024-01-05: 5 mL via INTRAVENOUS

## 2024-02-02 ENCOUNTER — Ambulatory Visit (INDEPENDENT_AMBULATORY_CARE_PROVIDER_SITE_OTHER): Admitting: Otolaryngology

## 2024-02-02 ENCOUNTER — Encounter (INDEPENDENT_AMBULATORY_CARE_PROVIDER_SITE_OTHER): Payer: Self-pay | Admitting: Otolaryngology

## 2024-02-02 VITALS — BP 104/68 | HR 70

## 2024-02-02 DIAGNOSIS — H93A2 Pulsatile tinnitus, left ear: Secondary | ICD-10-CM

## 2024-02-03 NOTE — Progress Notes (Signed)
 Patient ID: Alisha [name], female   DOB: 03-17-1966, 58 y.o.   MRN: [id number]  Follow-up: Left ear pulsatile tinnitus  HPI: The patient is a 58 year old female who returns today for follow-up evaluation.  The patient was last seen in April 2025.  At that time, she was complaining of left ear pulsatile tinnitus for more than 4 months.  Her otologic examination was normal.  Her hearing test showed bilateral normal hearing. Her previous CT angiography of her head and neck showed a left high riding jugular bulb. She was also noted to have dehiscence of bilateral carotid canal.  She also underwent an MRI scan.  The MRI was negative for any significant abnormalities.  The patient returns today complaining of persistent left ear pulsatile tinnitus.  She denies any otalgia, otorrhea, or vertigo.  Exam: General: Communicates without difficulty, well nourished, no acute distress. Head: Normocephalic, no evidence injury, no tenderness, facial buttresses intact without stepoff. Face/sinus: No tenderness to palpation and percussion. Facial movement is normal and symmetric. Eyes: PERRL, EOMI. No scleral icterus, conjunctivae clear. Neuro: CN II exam reveals vision grossly intact.  No nystagmus at any point of gaze. Ears: Auricles well formed without lesions.  Ear canals are intact without mass or lesion.  No erythema or edema is appreciated.  The TMs are intact without fluid. Nose: External evaluation reveals normal support and skin without lesions.  Dorsum is intact.  Anterior rhinoscopy reveals normal mucosa over anterior aspect of inferior turbinates and intact septum.  No purulence noted. Oral:  Oral cavity and oropharynx are intact, symmetric, without erythema or edema.  Mucosa is moist without lesions. Neck: Full range of motion without pain.  There is no significant lymphadenopathy.  No masses palpable.  Thyroid bed within normal limits to palpation.  Parotid glands and submandibular glands equal bilaterally  without mass.  Trachea is midline. Neuro:  CN 2-12 grossly intact.   Assessment: 1.  Left ear pulsatile tinnitus of unknown etiology.  Her MRI and CT angiography were all negative. 2.  Her ear canals, tympanic membranes, and middle ear spaces are normal.  Plan: 1.  The physical exam findings and the imaging results are reviewed with the patient. 2.  The patient is reassured that no significant abnormality is noted today. 3.  The strategies to cope with tinnitus are again discussed. 4.  The patient is encouraged to call for any questions or concerns.

## 2024-08-02 ENCOUNTER — Ambulatory Visit
Admission: RE | Admit: 2024-08-02 | Discharge: 2024-08-02 | Disposition: A | Source: Ambulatory Visit | Attending: Internal Medicine

## 2024-08-02 ENCOUNTER — Other Ambulatory Visit: Payer: Self-pay | Admitting: Internal Medicine

## 2024-08-02 DIAGNOSIS — M79671 Pain in right foot: Secondary | ICD-10-CM
